# Patient Record
Sex: Male | Born: 1995 | Race: Black or African American | Hispanic: No | Marital: Married | Smoking: Never smoker
Health system: Southern US, Community
[De-identification: ages and names within clinical notes are randomized; demographics above are authoritative.]

---

## 2015-03-05 ENCOUNTER — Emergency Department (HOSPITAL_COMMUNITY)
Admission: EM | Admit: 2015-03-05 | Discharge: 2015-03-05 | Disposition: A | Discharge status: Home / Self Care | Payer: Medicaid Other | Attending: Emergency Medicine | Admitting: Emergency Medicine

## 2015-03-05 ENCOUNTER — Encounter (HOSPITAL_COMMUNITY): Payer: Self-pay | Admitting: Emergency Medicine

## 2015-03-05 DIAGNOSIS — R51 Headache: Secondary | ICD-10-CM | POA: Insufficient documentation

## 2015-03-05 DIAGNOSIS — A630 Anogenital (venereal) warts: Secondary | ICD-10-CM

## 2015-03-05 DIAGNOSIS — R519 Headache, unspecified: Secondary | ICD-10-CM

## 2015-03-05 LAB — URINALYSIS, ROUTINE W REFLEX MICROSCOPIC
Glucose, UA: NEGATIVE mg/dL
Hgb urine dipstick: NEGATIVE
KETONES UR: 15 mg/dL — AB
Leukocytes, UA: NEGATIVE
NITRITE: NEGATIVE
PROTEIN: 30 mg/dL — AB
SPECIFIC GRAVITY, URINE: 1.033 — AB (ref 1.005–1.030)
UROBILINOGEN UA: 1 mg/dL (ref 0.0–1.0)
pH: 5.5 (ref 5.0–8.0)

## 2015-03-05 LAB — URINE MICROSCOPIC-ADD ON

## 2015-03-05 MED ORDER — ACETAMINOPHEN 325 MG PO TABS
650.0000 mg | ORAL_TABLET | Freq: Once | ORAL | Status: AC
  Administered 2015-03-05: 650 mg via ORAL
  Filled 2015-03-05: qty 2

## 2015-03-05 NOTE — ED Provider Notes (Signed)
The patient is a 19 year old male, recently moved here from [place], complains of mild headache but also complains of multiple lesions on his penis. On exam the patient has normal neurologic exam, soft abdomen, multiple small flat papular lesions on the shaft of his penis consistent with genital warts, no discharge from the urethral meatus, well-appearing otherwise. We'll test for STDs, send off for HIV, patient appears stable for discharge. We'll start anti-inflammatory for headache.  Medical screening examination/treatment/procedure(s) were conducted as a shared visit with non-physician practitioner(s) and myself.  I personally evaluated the patient during the encounter.  Clinical Impression:   Final diagnoses:  Genital warts  Headache, unspecified headache type         Eber Hong, MD 03/05/15 1534

## 2015-03-05 NOTE — ED Notes (Signed)
Patient states headache x 2 days.   Patient states has not tried anything for the headache at home.

## 2015-03-05 NOTE — ED Provider Notes (Signed)
CSN: [id number]     Arrival date & time 03/05/15  1017 History   First MD Initiated Contact with Patient 03/05/15 1130     Chief Complaint  Patient presents with  . Headache   Used interpretor services for interview  HPI  Calvin Moore is a 19 year old male with no significant past medical history presenting with a headache x 2 days. Pt reports generalized headache that has been constant since Wednesday. States the pain was very severe yesterday but has improved since last night. He has not tried any over the counter medicines for pain control. Denies fevers, visual disturbances, syncope, neck pain, abdominal pain, nausea or vomiting. Denies recent head traumas. Pt also complaining of rash to his genitals. States it has been there for 2-3 months. Denies dysuria, penile discharge, penile pain or testicular pain. Pt is recent immigrant from [place] in past 2 months and has not established primary care yet.   History reviewed. No pertinent past medical history. History reviewed. No pertinent past surgical history. No family history on file. Social History  Substance Use Topics  . Smoking status: Never Smoker   . Smokeless tobacco: None  . Alcohol Use: No    Review of Systems  Constitutional: Negative for fever and chills.  Eyes: Negative for visual disturbance.  Gastrointestinal: Negative for nausea and vomiting.  Genitourinary: Negative for dysuria, discharge, penile pain and testicular pain.  Musculoskeletal: Negative for neck pain and neck stiffness.  Skin: Positive for rash.  Neurological: Positive for headaches. Negative for syncope.      Allergies  Review of patient's allergies indicates no known allergies.  Home Medications   Prior to Admission medications   Not on File   BP 145/89 mmHg  Pulse 70  Temp(Src) 98 F (36.7 C) (Oral)  Resp 20  SpO2 100% Physical Exam  Constitutional: He is oriented to person, place, and time. He appears well-developed and  well-nourished.  HENT:  Head: Normocephalic and atraumatic.  Mouth/Throat: Oropharynx is clear and moist. No oropharyngeal exudate.  Eyes: Conjunctivae and EOM are normal. Pupils are equal, round, and reactive to light.  Neck: Normal range of motion.  Cardiovascular: Normal rate, regular rhythm and normal heart sounds.   Pulmonary/Chest: Effort normal. No respiratory distress. He has no wheezes. He has no rales.  Abdominal: Soft. There is no tenderness.  Genitourinary: Testes normal. Right testis shows no tenderness. Left testis shows no tenderness. Circumcised. No penile erythema or penile tenderness. No discharge found.  Multiple flesh colored papules consistent with genital warts over the penis. No erythema or open lesions over penis or scrotum.   Musculoskeletal: Normal range of motion.  Neurological: He is alert and oriented to person, place, and time. No cranial nerve deficit.  5/5 motor strength in extremities. Sensation to light touch intact throughout  Skin: Skin is warm and dry. Rash noted.  Psychiatric: He has a normal mood and affect. His behavior is normal.  Nursing note and vitals reviewed.   ED Course  Procedures (including critical care time) Labs Review Labs Reviewed  URINALYSIS, ROUTINE W REFLEX MICROSCOPIC (NOT AT [facility]) - Abnormal; Notable for the following:    Color, Urine AMBER (*)    Specific Gravity, Urine 1.033 (*)    Bilirubin Urine SMALL (*)    Ketones, ur 15 (*)    Protein, ur 30 (*)    All other components within normal limits  URINE MICROSCOPIC-ADD ON - Abnormal; Notable for the following:    Squamous Epithelial /  LPF FEW (*)    Bacteria, UA FEW (*)    All other components within normal limits  HIV ANTIBODY (ROUTINE TESTING)  GC/CHLAMYDIA PROBE AMP ([place]) NOT AT [facility]    Imaging Review No results found. I have personally reviewed and evaluated these images and lab results as part of my medical decision-making.   EKG Interpretation None       MDM   Final diagnoses:  Genital warts  Headache, unspecified headache type   Pt presenting with headache and genital rash. Pt states headache has been improving since onset 2 days ago. Denies visual or neurological symptoms. Also complaining of genital warts x 2-3 months. No penile discharge or testicular tenderness. VSS. Pt nontoxic. Non-focal neurological exam. Pelvic exam consistent with genital warts. Tylenol given in ED with improvement in headache. Discussed with pt that we cannot treat genital warts in ED, will give him referral to [facility] for further discussion of treatment options. Educated on safe sex practices to prevent spread of warts. Will call pt if GC or HIV comes back positive. Bacteria noted in urine, will send urine for culture. Pt not complaining of urinary symptoms at this time. Pt may use OTC tylenol for headache control. Return precautions given in discharge paperwork and discussed with pt. Pt stable for discharge.     Alveta Heimlich, PA-C 03/05/15 1322  Eber Hong, MD 03/05/15 1534

## 2015-03-05 NOTE — Discharge Instructions (Signed)
-   Call [facility] to establish primary care and further discuss genital warts treatment - If gonorrhea, chlamydia or HIV is positive, the hospital will call you with results - Continue to take tylenol for headache - Return to ED with fever, increasing severity of headache, neck pain, changes in vision, weakness or numbness, penile discharge, testicular pain or worsening symptoms

## 2015-03-06 LAB — HIV ANTIBODY (ROUTINE TESTING W REFLEX): HIV Screen 4th Generation wRfx: NONREACTIVE

## 2015-03-07 LAB — URINE CULTURE: CULTURE: NO GROWTH

## 2015-03-08 LAB — GC/CHLAMYDIA PROBE AMP (~~LOC~~) NOT AT ARMC
Chlamydia: NEGATIVE
Neisseria Gonorrhea: NEGATIVE

## 2016-02-22 ENCOUNTER — Emergency Department (HOSPITAL_COMMUNITY)
Admission: EM | Admit: 2016-02-22 | Discharge: 2016-02-22 | Disposition: A | Discharge status: Home / Self Care | Payer: BLUE CROSS/BLUE SHIELD | Attending: Emergency Medicine | Admitting: Emergency Medicine

## 2016-02-22 ENCOUNTER — Encounter (HOSPITAL_COMMUNITY): Payer: Self-pay | Admitting: Emergency Medicine

## 2016-02-22 DIAGNOSIS — S80211A Abrasion, right knee, initial encounter: Secondary | ICD-10-CM | POA: Insufficient documentation

## 2016-02-22 DIAGNOSIS — Y999 Unspecified external cause status: Secondary | ICD-10-CM | POA: Insufficient documentation

## 2016-02-22 DIAGNOSIS — Y939 Activity, unspecified: Secondary | ICD-10-CM | POA: Insufficient documentation

## 2016-02-22 DIAGNOSIS — S80212A Abrasion, left knee, initial encounter: Secondary | ICD-10-CM | POA: Insufficient documentation

## 2016-02-22 DIAGNOSIS — Y92410 Unspecified street and highway as the place of occurrence of the external cause: Secondary | ICD-10-CM | POA: Insufficient documentation

## 2016-02-22 DIAGNOSIS — Z23 Encounter for immunization: Secondary | ICD-10-CM | POA: Insufficient documentation

## 2016-02-22 DIAGNOSIS — T148XXA Other injury of unspecified body region, initial encounter: Secondary | ICD-10-CM

## 2016-02-22 DIAGNOSIS — S39012A Strain of muscle, fascia and tendon of lower back, initial encounter: Secondary | ICD-10-CM

## 2016-02-22 MED ORDER — TETANUS-DIPHTH-ACELL PERTUSSIS 5-2.5-18.5 LF-MCG/0.5 IM SUSP
0.5000 mL | Freq: Once | INTRAMUSCULAR | Status: AC
  Administered 2016-02-22: 0.5 mL via INTRAMUSCULAR
  Filled 2016-02-22: qty 0.5

## 2016-02-22 MED ORDER — IBUPROFEN 800 MG PO TABS
800.0000 mg | ORAL_TABLET | Freq: Once | ORAL | Status: AC
  Administered 2016-02-22: 800 mg via ORAL
  Filled 2016-02-22: qty 1

## 2016-02-22 MED ORDER — METHOCARBAMOL 500 MG PO TABS
500.0000 mg | ORAL_TABLET | Freq: Once | ORAL | Status: AC
  Administered 2016-02-22: 500 mg via ORAL
  Filled 2016-02-22: qty 1

## 2016-02-22 MED ORDER — METHOCARBAMOL 500 MG PO TABS: 500.0000 mg | ORAL_TABLET | Freq: Two times a day (BID) | ORAL | 0 refills | Status: DC

## 2016-02-22 MED ORDER — NAPROXEN 500 MG PO TABS: 500.0000 mg | ORAL_TABLET | Freq: Two times a day (BID) | ORAL | 0 refills | Status: DC
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
## 2016-08-01 ENCOUNTER — Encounter (HOSPITAL_COMMUNITY): Payer: Self-pay

## 2016-08-01 ENCOUNTER — Emergency Department (HOSPITAL_COMMUNITY): Payer: Self-pay

## 2016-08-01 ENCOUNTER — Emergency Department (HOSPITAL_COMMUNITY)
Admission: EM | Admit: 2016-08-01 | Discharge: 2016-08-01 | Disposition: A | Discharge status: Home / Self Care | Payer: Self-pay | Attending: Emergency Medicine | Admitting: Emergency Medicine

## 2016-08-01 DIAGNOSIS — Y999 Unspecified external cause status: Secondary | ICD-10-CM | POA: Insufficient documentation

## 2016-08-01 DIAGNOSIS — Y92009 Unspecified place in unspecified non-institutional (private) residence as the place of occurrence of the external cause: Secondary | ICD-10-CM | POA: Insufficient documentation

## 2016-08-01 DIAGNOSIS — M25531 Pain in right wrist: Secondary | ICD-10-CM | POA: Insufficient documentation

## 2016-08-01 DIAGNOSIS — Y939 Activity, unspecified: Secondary | ICD-10-CM | POA: Insufficient documentation

## 2016-08-01 DIAGNOSIS — W1839XA Other fall on same level, initial encounter: Secondary | ICD-10-CM | POA: Insufficient documentation

## 2016-08-01 MED ORDER — IBUPROFEN 600 MG PO TABS: 600.0000 mg | ORAL_TABLET | Freq: Four times a day (QID) | ORAL | 0 refills | Status: DC | PRN

## 2016-08-01 NOTE — ED Provider Notes (Signed)
I was called by radiologist, Dr Constance Goltzlson.  He ammended his xray reading.  He is recommending a repeat imaging test because of the fad pad finding.  Pt was sent home with recommendations to follow up with orthopedics and was splinted considering that xray finding.   Linwood DibblesJon Howard Patton, MD 08/01/16 [phone]

## 2016-08-01 NOTE — ED Provider Notes (Signed)
[facility]-EMERGENCY DEPT Provider Note   CSN: [id number] Arrival date & time: 08/01/16  1203  By signing my name below, I, Teofilo PodMatthew P. Jamison, attest that this documentation has been prepared under the direction and in the presence of Arthor CaptainAbigail Lakeesha Fontanilla, PA-C. Electronically Signed: Teofilo PodMatthew P. Jamison, ED Scribe. 08/01/2016. 2:46 PM.    History   Chief Complaint No chief complaint on file.   The history is provided by the patient. The history is limited by a language barrier. A language interpreter was used.   HPI Comments:  Calvin Moore is a 21 y.o. male who presents to the Emergency Department s/p right wrist injury that occurred yesterday. Pt reports that he fell at home, bracing his fall with his right hand, and states that today his right wrist became increasingly swollen and painful today. Pt applied an ointment to his wrist that provided some relief. He states that the pain has improved significantly since yesterday. Pt is right hand dominant and work at a [employer]. Pt denies other associated symptoms.   History reviewed. No pertinent past medical history.  There are no active problems to display for this patient.   History reviewed. No pertinent surgical history.     Home Medications    Prior to Admission medications   Medication Sig Start Date End Date Taking? Authorizing Provider  methocarbamol (ROBAXIN) 500 MG tablet Take 1 tablet (500 mg total) by mouth 2 (two) times daily. 02/22/16   April Palumbo, MD  naproxen (NAPROSYN) 500 MG tablet Take 1 tablet (500 mg total) by mouth 2 (two) times daily. 02/22/16   April Palumbo, MD    Family History No family history on file.  Social History Social History  Substance Use Topics  . Smoking status: Never Smoker  . Smokeless tobacco: Not on file  . Alcohol use No     Allergies   Patient has no known allergies.   Review of Systems Review of Systems  Constitutional: Negative for fever.  Musculoskeletal: Positive for  arthralgias and joint swelling.     Physical Exam Updated Vital Signs BP 132/80 (BP Location: Left Arm)   Pulse 85   Temp 98.7 F (37.1 C) (Oral)   Resp 18   SpO2 100%   Physical Exam  Constitutional: He appears well-developed and well-nourished. No distress.  HENT:  Head: Normocephalic and atraumatic.  Eyes: Conjunctivae are normal.  Cardiovascular: Normal rate.   Pulmonary/Chest: Effort normal.  Abdominal: He exhibits no distension.  Musculoskeletal:  Pain and swelling over right scaphoid. Pain with ulnar deviation of wrist on the radial side.   Neurological: He is alert.  Skin: Skin is warm and dry.  Psychiatric: He has a normal mood and affect.  Nursing note and vitals reviewed.    ED Treatments / Results  DIAGNOSTIC STUDIES:  Oxygen Saturation is 100% on RA, normal by my interpretation.    COORDINATION OF CARE:  2:42 PM Will order xray. Discussed treatment plan with pt at bedside and pt agreed to plan.   Labs (all labs ordered are listed, but only abnormal results are displayed) Labs Reviewed - No data to display  EKG  EKG Interpretation None       Radiology No results found.  Procedures Procedures (including critical care time)  Medications Ordered in ED Medications - No data to display   Initial Impression / Assessment and Plan / ED Course  I have reviewed the triage vital signs and the nursing notes.  Pertinent labs & imaging results that  were available during my care of the patient were reviewed by me and considered in my medical decision making (see chart for details).     Patient with pain over the scaphoid. Images pending. I have given sign out to the oncoming PA.   Final Clinical Impressions(s) / ED Diagnoses   Final diagnoses:  Right wrist pain    New Prescriptions New Prescriptions   No medications on file  I personally performed the services described in this documentation, which was scribed in my presence. The recorded  information has been reviewed and is accurate.       Arthor Captain, PA-C 08/08/16 1610    Linwood Dibbles, MD 08/08/16 [phone]

## 2016-08-01 NOTE — ED Notes (Signed)
Through interpretor. Pt state fall on outstretched hand on Monday afternoon about 2 PM.

## 2016-08-01 NOTE — ED Notes (Signed)
Unable to esign due to computor.

## 2016-08-01 NOTE — ED Provider Notes (Signed)
Care assumed from previous provider PA Harris. Please see note for further details. To here for right wrist pain. Case discussed, plan agreed upon. Will follow up on pending x-ray.   X-ray reviewed showing no fracture or dislocation. It does note a slight prominent appearance of the pronator quadratus fat pad which can be a secondary finding with distal radial fracture. Patient is tender to palpation over the area on exam. Concern for occult distal radial fracture. Discussed findings with patient using [employer] interpreters. Recommended applying splint, however patient does not want this to be done. Will place him in Velcro wrist splint. Orthopedic follow-up strongly encouraged, especially if symptoms persist. Ibuprofen as needed for pain. Home care instructions discussed and all questions answered.   [facility] Regional HospitalJaime Pilcher Dior Stepter, PA-C 08/01/16 1713    Calvin DibblesJon Knapp, MD 08/01/16 Calvin [id number]

## 2016-08-01 NOTE — ED Triage Notes (Addendum)
Patient complains of right wrist pain x 1 week, pain with any movement, unsure if injury occurred, need interpretor. Couldn't connect at triage. No swelling, no deformity

## 2016-08-01 NOTE — Discharge Instructions (Signed)
Keep splint on at all times for the next week. If pain persists on Friday, please call the orthopedic clinic listed to schedule a follow up appointment. Return to ER for new or worsening symptoms, any additional concerns.   COLD THERAPY DIRECTIONS:  Ice or gel packs can be used to reduce both pain and swelling. Ice is the most helpful within the first 24 to 48 hours after an injury or flareup from overusing a muscle or joint.  Ice is effective, has very few side effects, and is safe for most people to use.   If you expose your skin to cold temperatures for too long or without the proper protection, you can damage your skin or nerves. Watch for signs of skin damage due to cold.   HOME CARE INSTRUCTIONS  Follow these tips to use ice and cold packs safely.  Place a dry or damp towel between the ice and skin. A damp towel will cool the skin more quickly, so you may need to shorten the time that the ice is used.  For a more rapid response, add gentle compression to the ice.  Ice for no more than 10 to 20 minutes at a time. The bonier the area you are icing, the less time it will take to get the benefits of ice.  Check your skin after 5 minutes to make sure there are no signs of a poor response to cold or skin damage.  Rest 20 minutes or more in between uses.  Once your skin is numb, you can end your treatment. You can test numbness by very lightly touching your skin. The touch should be so light that you do not see the skin dimple from the pressure of your fingertip. When using ice, most people will feel these normal sensations in this order: cold, burning, aching, and numbness.

## 2016-08-01 NOTE — ED Notes (Addendum)
Pt states through interpretor that he understands instructions. All questions answered.Home stable stable with steady gait after pt states that he will return here for any new or worsening problems.

## 2016-08-01 NOTE — ED Notes (Signed)
Patient returned from xray.

## 2016-08-03 ENCOUNTER — Encounter (HOSPITAL_COMMUNITY): Payer: Self-pay | Admitting: Emergency Medicine

## 2016-08-03 ENCOUNTER — Emergency Department (HOSPITAL_COMMUNITY)
Admission: EM | Admit: 2016-08-03 | Discharge: 2016-08-03 | Disposition: A | Discharge status: Home / Self Care | Payer: Medicaid Other | Attending: Emergency Medicine | Admitting: Emergency Medicine

## 2016-08-03 DIAGNOSIS — S63501D Unspecified sprain of right wrist, subsequent encounter: Secondary | ICD-10-CM | POA: Insufficient documentation

## 2016-08-03 DIAGNOSIS — S63501A Unspecified sprain of right wrist, initial encounter: Secondary | ICD-10-CM

## 2016-08-03 DIAGNOSIS — X58XXXD Exposure to other specified factors, subsequent encounter: Secondary | ICD-10-CM | POA: Insufficient documentation

## 2016-08-03 NOTE — ED Notes (Signed)
ED Provider at bedside. 

## 2016-08-03 NOTE — ED Notes (Signed)
NAD at this time. Pt is stable and going home.  
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
## 2016-08-03 NOTE — Discharge Instructions (Signed)
Please follow up with family doctor or urgent care as needed.

## 2016-08-03 NOTE — ED Provider Notes (Signed)
[facility]-EMERGENCY DEPT Provider Note   CSN: [id number] Arrival date & time: 08/03/16  1024     History   Chief Complaint Chief Complaint  Patient presents with  . work note    HPI Calvin GurneyHenry Moore is a 21 y.o. male.  HPI Calvin Moore is a 21 y.o. male presents to emergency department requesting a work note to go back to work. Patient injured his wrist several days ago. He was seen on 08/01/16, just 2 days ago, and initial x-ray was performed which was negative. According to the notes, radiologist called back and repeated imaging was recommended due to some "fat pad finding", and it was recommended that he would have an MRI or repeat imaging in 7-10 days if not improving. Patient is here because he states his wrist is completely better and he is having no pain in his wrist and no pain with movement. He states that his job would not let him go back because of a prior work note which restricted him from heavy lifting for 7 days. 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 He states that he has no pain at all and would like to be cleared to go back to work.  History reviewed. No pertinent past medical history.  There are no active problems to display for this patient.   History reviewed. No pertinent surgical history.     Home Medications    Prior to Admission medications   Medication Sig Start Date End Date Taking? Authorizing Provider  ibuprofen (ADVIL,MOTRIN) 600 MG tablet Take 1 tablet (600 mg total) by mouth every 6 (six) hours as needed for moderate pain. 08/01/16   Chase PicketJaime Pilcher Ward, PA-C  methocarbamol (ROBAXIN) 500 MG tablet Take 1 tablet (500 mg total) by mouth 2 (two) times daily. 02/22/16   Cy BlamerApril Palumbo, MD    Family History History reviewed. No pertinent family history.  Social History Social History  Substance Use Topics  . Smoking status: Never Smoker  . Smokeless tobacco: Not on file  . Alcohol use No     Allergies   Patient has no known  allergies.   Review of Systems Review of Systems  Constitutional: Negative for fever.  Musculoskeletal: Positive for arthralgias.  Neurological: Negative for weakness and numbness.     Physical Exam Updated Vital Signs BP 131/70 (BP Location: Left Arm)   Pulse 76   Temp 97.5 F (36.4 C) (Oral)   Resp 18   SpO2 100%   Physical Exam  Constitutional: He appears well-developed and well-nourished. No distress.  Eyes: Conjunctivae are normal.  Neck: Neck supple.  Cardiovascular: Normal rate.   Pulmonary/Chest: No respiratory distress.  Abdominal: He exhibits no distension.  Musculoskeletal:  Normal appearing right wrist. No ttp over wrist joint, carpals, specifically no pain over scaphoid. Full ROM of the wrist and all fingers. Distal radial pulse intact  Skin: Skin is warm and dry.  Nursing note and vitals reviewed.    ED Treatments / Results  Labs (all labs ordered are listed, but only abnormal results are displayed) Labs Reviewed - No data to display  EKG  EKG Interpretation None       Radiology Dg Wrist Complete Right  Addendum Date: 08/01/2016   ADDENDUM REPORT: 08/01/2016 18:43 ADDENDUM: If the patient has persistent discomfort, follow-up plain film exam in 7-10 days or MR can be obtained for further delineation to exclude occult injury. These results were called by telephone at the time of interpretation on 08/01/2016  at 6:40 pm to Dr. Linwood Dibbles, who verbally acknowledged these results. Electronically Signed   By: Lacy Duverney M.D.   On: 08/01/2016 18:43   Result Date: 08/01/2016 CLINICAL DATA:  21 year old fell yesterday with wrist pain. Initial encounter. EXAM: RIGHT WRIST - COMPLETE 3+ VIEW COMPARISON:  None. FINDINGS: Slightly prominent appearance of the pronator quadratus fat pad (can be a secondary finding with distal radial fracture) however, no underlying fracture noted. Top-normal size space between the scaphoid and the lunate. IMPRESSION: No fracture or  dislocation noted. Electronically Signed: By: Lacy Duverney M.D. On: 08/01/2016 16:36    Procedures Procedures (including critical care time)  Medications Ordered in ED Medications - No data to display   Initial Impression / Assessment and Plan / ED Course  I have reviewed the triage vital signs and the nursing notes.  Pertinent labs & imaging results that were available during my care of the patient were reviewed by me and considered in my medical decision making (see chart for details).     Patient in emergency department requesting a note to go back to work. He injured his wrist several days ago, he was seen here and imaged 2 days ago with x-ray being read as negative however it was recommended to repeat plain film in 7-10 days or MRI for further evaluation. Patient is here because his wrist does not hurt any longer, and he would like to go back to work. His exam is normal. He has no tenderness over the wrist joint or scaphoid. I do not think that repeat imaging at 2 days would be helpful for further evaluation and given that he has no pain or findings on exam, I do think it is okay that he goes back to work. I have instructed him to follow-up with his doctor or urgent care if his wrist becomes painful again. I will give him a work note.  Swahili  language interpreter was used for [employer]  Final Clinical Impressions(s) / ED Diagnoses   Final diagnoses:  Sprain of right wrist, initial encounter    New Prescriptions Discharge Medication List as of 08/03/2016 11:58 AM       Jaynie Crumble, PA-C 08/03/16 1330    Nira Conn, MD 08/03/16 1400

## 2016-08-03 NOTE — ED Triage Notes (Signed)
Pt is here requesting a change in his work note due to restrictions. Pt states he wants to go back to work with no restrictions. Pt speaks no english, translated using interpreter line. Pt has wrist brace on right wrist. I informed patient that I am unable to change his work note limitations per the MD and he will need to be reassessed pt verbalized understanding.

## 2017-10-25 ENCOUNTER — Other Ambulatory Visit: Payer: Self-pay

## 2017-10-25 ENCOUNTER — Encounter (HOSPITAL_COMMUNITY): Payer: Self-pay | Admitting: Emergency Medicine

## 2017-10-25 ENCOUNTER — Emergency Department (HOSPITAL_COMMUNITY)
Admission: EM | Admit: 2017-10-25 | Discharge: 2017-10-25 | Disposition: A | Discharge status: Home / Self Care | Payer: BLUE CROSS/BLUE SHIELD | Attending: Emergency Medicine | Admitting: Emergency Medicine

## 2017-10-25 DIAGNOSIS — M25562 Pain in left knee: Secondary | ICD-10-CM | POA: Insufficient documentation

## 2017-10-25 DIAGNOSIS — R030 Elevated blood-pressure reading, without diagnosis of hypertension: Secondary | ICD-10-CM

## 2017-10-25 DIAGNOSIS — Z79899 Other long term (current) drug therapy: Secondary | ICD-10-CM | POA: Insufficient documentation

## 2017-10-25 DIAGNOSIS — M25561 Pain in right knee: Secondary | ICD-10-CM | POA: Insufficient documentation

## 2017-10-25 DIAGNOSIS — M79609 Pain in unspecified limb: Secondary | ICD-10-CM

## 2017-10-25 NOTE — Discharge Instructions (Addendum)
Please see the information and instructions below regarding your visit.  Your diagnoses today include:  1. Popliteal pain    We suspect that you have muscle strain secondary to work.  There appears to be no emergent causes.  Please follow-up with any of the first 3 clinics listed in your paperwork for primary care.  Please follow-up with the orthopedic doctor, Dr. Jena Gauss regarding her leg pain.  You also have elevated blood pressure today.  It is very important that she follow-up with the first 3 clinics I listed on the paperwork to establish a primary care provider who is a regular doctor for you.  Tests performed today include: See side panel of your discharge paperwork for testing performed today. Vital signs are listed at the bottom of these instructions.   Medications prescribed:    Take any prescribed medications only as prescribed, and any over the counter medications only as directed on the packaging.  Home care instructions:  Please follow any educational materials contained in this packet.   Follow-up instructions: Please follow-up with your primary care provider in as soon as possible for further evaluation of your symptoms if they are not completely improved.   Please help with orthopedics for further evaluation of your legs.  Return instructions:  Please return to the Emergency Department if you experience worsening symptoms. Please return the emergency department if you develop any worsening leg pain, swelling of the legs, change in color of the legs. Please return if you have any other emergent concerns.  Additional Information:   Your vital signs today were: BP (!) 149/82 (BP Location: Right Arm)    Pulse 72    Temp 98.9 F (37.2 C) (Oral)    Resp 18    Ht  (1.778 m)    Wt 72.6 kg (160 lb)    SpO2 100%    BMI 22.96 kg/m  If your blood pressure (BP) was elevated on multiple readings during this visit above 130 for the top number or above 80 for the bottom  number, please have this repeated by your primary care provider within one month. --------------  Thank you for allowing [place] to participate in your care today.

## 2017-10-25 NOTE — ED Notes (Signed)
Pt verbalizes understanding of d/c instructions. Pt ambulatory at d/c with all belongings.   

## 2017-10-25 NOTE — ED Triage Notes (Signed)
Pt speaks Swahili, presents with R leg pain that started a few days ago, pt thinks it is r/t to overuse at work. Pt presents with a "return note" from his job, states that pt was in [facility] today which is NOT true.

## 2017-10-25 NOTE — ED Notes (Addendum)
ED Provider at bedside. Audio interpreter in use.

## 2017-10-26 NOTE — ED Provider Notes (Signed)
[facility] EMERGENCY DEPARTMENT Provider Note   CSN: [id number] Arrival date & time: 10/25/17  1920     History   Chief Complaint Chief Complaint  Patient presents with  . Leg Pain    HPI Albion Weatherholtz is a 22 y.o. male.  HPI  Patient is a 22 year old male with no significant past medical history presenting for popliteal discomfort alternating between bilateral legs.  Patient primarily complains of the left leg today.  Patient reports that approximately 5 days ago he began having a soreness in the popliteal region of his right leg.  Subsequently, over the last 2 days he developed discomfort that he describes only as a "pain" in the popliteal region of the left leg.  Patient reporting that he only feels pain when he has to do lifting and twisting duties at work.  Patient reports that he does significant squatting and lifting in his job, and presented to the facility nurse to request later duty due to pain in his legs.  Patient was instructed that he needed to follow-up with a physician who could clear him and alter his duties.  Patient reports that he believes his legs have muscle soreness secondary to the type of work he does.  Patient denies any swelling of the legs, change in color, paresthesias, or weakness.  Patient denies any history of DVT/PE, hormone use, cancer history of treatment, recent immobilization, hospitalization, recent surgery.  Patient denies any chest pain or shortness of breath.  Patient reports that he has tried icing the legs and taking a "medicine", which did not provide for relief.  History obtained with assistance of [place] voice call Swahili interpreter, Lina Sar [phone].  History reviewed. No pertinent past medical history.  There are no active problems to display for this patient.   History reviewed. No pertinent surgical history.      Home Medications    Prior to Admission medications   Medication Sig Start Date End Date Taking?  Authorizing Provider  ibuprofen (ADVIL,MOTRIN) 600 MG tablet Take 1 tablet (600 mg total) by mouth every 6 (six) hours as needed for moderate pain. 08/01/16   Ward, Chase Picket, PA-C  methocarbamol (ROBAXIN) 500 MG tablet Take 1 tablet (500 mg total) by mouth 2 (two) times daily. 02/22/16   Palumbo, April, MD    Family History No family history on file.  Social History Social History   Tobacco Use  . Smoking status: Never Smoker  Substance Use Topics  . Alcohol use: No  . Drug use: No     Allergies   Patient has no known allergies.   Review of Systems Review of Systems  Constitutional: Negative for chills and fever.  Cardiovascular: Negative for leg swelling.  Musculoskeletal: Positive for arthralgias and myalgias. Negative for gait problem and joint swelling.  Skin: Negative for color change.  Neurological: Negative for weakness and numbness.     Physical Exam Updated Vital Signs BP (!) 149/82 (BP Location: Right Arm)   Pulse 72   Temp 98.9 F (37.2 C) (Oral)   Resp 18   Ht  (1.778 m)   Wt 72.6 kg (160 lb)   SpO2 100%   BMI 22.96 kg/m   Physical Exam  Constitutional: He appears well-developed and well-nourished. No distress.  Sitting comfortably in bed.  HENT:  Head: Normocephalic and atraumatic.  Eyes: Conjunctivae are normal. Right eye exhibits no discharge. Left eye exhibits no discharge.  EOMs normal to gross examination.  Neck: Normal range of  motion.  Cardiovascular: Normal rate, regular rhythm and normal heart sounds.  No murmur heard. Intact, 2+ DP and PT pulses bilaterally.  Pulmonary/Chest:  Normal respiratory effort. Patient converses comfortably. No audible wheeze or stridor.  Abdominal: He exhibits no distension.  Musculoskeletal: Normal range of motion.  Patient exhibits normal range of motion with knee flexion, extension and foot and toe dorsiflexion and plantar flexion bilaterally. Left popliteal region with tenderness overlying  gastrocnemius and soleus insertions.  No mass.  No fluctuance.  Right popliteal region with minimal tenderness overlying gastric numbness and soleus tendons.  No mass or fluctuance palpated. Bilateral calves without edema or tenderness.  All compartments soft. 2+ DP and PT pulses bilaterally.  Capillary refill is approximately 3 seconds in bilateral great toes.  Neurological: He is alert.  Cranial nerves intact to gross observation. Patient moves extremities without difficulty.  Skin: Skin is warm and dry. He is not diaphoretic.  Psychiatric: He has a normal mood and affect. His behavior is normal. Judgment and thought content normal.  Nursing note and vitals reviewed.    ED Treatments / Results  Labs (all labs ordered are listed, but only abnormal results are displayed) Labs Reviewed - No data to display  EKG None  Radiology No results found.  Procedures Procedures (including critical care time)  Medications Ordered in ED Medications - No data to display   Initial Impression / Assessment and Plan / ED Course  I have reviewed the triage vital signs and the nursing notes.  Pertinent labs & imaging results that were available during my care of the patient were reviewed by me and considered in my medical decision making (see chart for details).     Patient nontoxic-appearing and neurovascularly intact in bilateral lower extremities.  Patient does exhibit delayed capillary refill but pulses are strong.  Doubt DVT, as patient has no risk factors for DVT, and there is no swelling focal pain along veins of popliteal region to suggest this pathology.  Doubt arterial occlusion, as patient has normal coloration of lower extremities, and no pain at rest.  Additionally, bilateral nature not suggestive of these pathologies.  Suspect strain of gastrocnemius and soleus tendons.  Bilateral knee sleeves ordered for patient, and paperwork for employer filled out to reduce duties.  Duties only  reduced for 4 days, patient instructed that he will need to follow-up with a primary care provider and orthopedics for further management.    This is a shared visit with Dr. Gerhard Munch. Patient was independently evaluated by this attending physician. Attending physician consulted in evaluation and discharge management.   Final Clinical Impressions(s) / ED Diagnoses   Final diagnoses:  Popliteal pain  Elevated blood pressure reading without diagnosis of hypertension    ED Discharge Orders    None       Delia Chimes 10/26/17 0418    Gerhard Munch, MD 10/27/17 [phone]

## 2020-09-15 ENCOUNTER — Other Ambulatory Visit: Payer: Self-pay

## 2020-09-15 ENCOUNTER — Emergency Department (HOSPITAL_COMMUNITY)
Admission: EM | Admit: 2020-09-15 | Discharge: 2020-09-15 | Disposition: A | Discharge status: Home / Self Care | Payer: No Typology Code available for payment source | Attending: Emergency Medicine | Admitting: Emergency Medicine

## 2020-09-15 ENCOUNTER — Encounter (HOSPITAL_COMMUNITY): Payer: Self-pay

## 2020-09-15 DIAGNOSIS — S01511A Laceration without foreign body of lip, initial encounter: Secondary | ICD-10-CM | POA: Diagnosis not present

## 2020-09-15 DIAGNOSIS — Y92410 Unspecified street and highway as the place of occurrence of the external cause: Secondary | ICD-10-CM | POA: Diagnosis not present

## 2020-09-15 DIAGNOSIS — S0993XA Unspecified injury of face, initial encounter: Secondary | ICD-10-CM | POA: Diagnosis present

## 2020-09-15 DIAGNOSIS — K0889 Other specified disorders of teeth and supporting structures: Secondary | ICD-10-CM

## 2020-09-15 MED ORDER — ACETAMINOPHEN 500 MG PO TABS
1000.0000 mg | ORAL_TABLET | Freq: Once | ORAL | Status: AC
  Administered 2020-09-15: 1000 mg via ORAL
  Filled 2020-09-15: qty 2

## 2020-09-15 MED ORDER — KETOROLAC TROMETHAMINE 60 MG/2ML IM SOLN
15.0000 mg | Freq: Once | INTRAMUSCULAR | Status: AC
  Administered 2020-09-15: 15 mg via INTRAMUSCULAR
  Filled 2020-09-15: qty 2

## 2020-09-15 NOTE — ED Notes (Signed)
Pt discharged from this ED in stable condition at this time. All discharge instructions and follow up care reviewed with pt with no further questions at this time. Pt ambulatory with steady gait, clear speech.  
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
## 2020-09-15 NOTE — Discharge Instructions (Addendum)
Take 4 over the counter ibuprofen tablets 3 times a day or 2 over-the-counter naproxen tablets twice a day for pain. Also take tylenol 1000mg (2 extra strength) four times a day.   Follow up with a dentist for your tooth.   Kunywa vidonge 4 vya ibuprofen mara 3 kwa siku au vidonge 2 vya naproxen mara mbili kwa siku kwa maumivu. Pia chukua tylenol 1000mg (nguvu 2 za ziada) mara nne kwa siku.   Fuata daktari wa meno kwa jino lako
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
## 2021-09-13 ENCOUNTER — Emergency Department (HOSPITAL_COMMUNITY): Payer: BC Managed Care – PPO

## 2021-09-13 ENCOUNTER — Inpatient Hospital Stay (HOSPITAL_COMMUNITY)
Admission: EM | Admit: 2021-09-13 | Discharge: 2021-09-24 | DRG: 178 | Disposition: A | Discharge status: Home / Self Care | Payer: BC Managed Care – PPO | Attending: Family Medicine | Admitting: Family Medicine

## 2021-09-13 ENCOUNTER — Other Ambulatory Visit: Payer: Self-pay

## 2021-09-13 DIAGNOSIS — E871 Hypo-osmolality and hyponatremia: Secondary | ICD-10-CM | POA: Diagnosis present

## 2021-09-13 DIAGNOSIS — Z79899 Other long term (current) drug therapy: Secondary | ICD-10-CM

## 2021-09-13 DIAGNOSIS — J90 Pleural effusion, not elsewhere classified: Secondary | ICD-10-CM | POA: Diagnosis not present

## 2021-09-13 DIAGNOSIS — Y95 Nosocomial condition: Secondary | ICD-10-CM | POA: Diagnosis present

## 2021-09-13 DIAGNOSIS — J851 Abscess of lung with pneumonia: Principal | ICD-10-CM | POA: Diagnosis present

## 2021-09-13 DIAGNOSIS — D696 Thrombocytopenia, unspecified: Secondary | ICD-10-CM | POA: Diagnosis present

## 2021-09-13 DIAGNOSIS — J189 Pneumonia, unspecified organism: Secondary | ICD-10-CM | POA: Diagnosis present

## 2021-09-13 DIAGNOSIS — R161 Splenomegaly, not elsewhere classified: Secondary | ICD-10-CM | POA: Diagnosis present

## 2021-09-13 DIAGNOSIS — F1010 Alcohol abuse, uncomplicated: Secondary | ICD-10-CM | POA: Diagnosis present

## 2021-09-13 DIAGNOSIS — D72819 Decreased white blood cell count, unspecified: Secondary | ICD-10-CM | POA: Diagnosis present

## 2021-09-13 DIAGNOSIS — F1090 Alcohol use, unspecified, uncomplicated: Secondary | ICD-10-CM | POA: Diagnosis present

## 2021-09-13 DIAGNOSIS — R1011 Right upper quadrant pain: Principal | ICD-10-CM

## 2021-09-13 DIAGNOSIS — I3139 Other pericardial effusion (noninflammatory): Secondary | ICD-10-CM | POA: Diagnosis present

## 2021-09-13 DIAGNOSIS — R0602 Shortness of breath: Secondary | ICD-10-CM

## 2021-09-13 DIAGNOSIS — Z20822 Contact with and (suspected) exposure to covid-19: Secondary | ICD-10-CM | POA: Diagnosis present

## 2021-09-13 DIAGNOSIS — J918 Pleural effusion in other conditions classified elsewhere: Secondary | ICD-10-CM | POA: Diagnosis present

## 2021-09-13 DIAGNOSIS — J9811 Atelectasis: Secondary | ICD-10-CM | POA: Diagnosis present

## 2021-09-13 DIAGNOSIS — E876 Hypokalemia: Secondary | ICD-10-CM

## 2021-09-13 DIAGNOSIS — J948 Other specified pleural conditions: Secondary | ICD-10-CM | POA: Diagnosis not present

## 2021-09-13 DIAGNOSIS — R0789 Other chest pain: Secondary | ICD-10-CM

## 2021-09-13 DIAGNOSIS — K429 Umbilical hernia without obstruction or gangrene: Secondary | ICD-10-CM | POA: Diagnosis present

## 2021-09-13 DIAGNOSIS — K7030 Alcoholic cirrhosis of liver without ascites: Secondary | ICD-10-CM | POA: Diagnosis present

## 2021-09-13 DIAGNOSIS — Z789 Other specified health status: Secondary | ICD-10-CM | POA: Diagnosis present

## 2021-09-13 LAB — CBC WITH DIFFERENTIAL/PLATELET
Abs Immature Granulocytes: 0.98 10*3/uL — ABNORMAL HIGH (ref 0.00–0.07)
Basophils Absolute: 0 10*3/uL (ref 0.0–0.1)
Basophils Relative: 0 %
Eosinophils Absolute: 0 10*3/uL (ref 0.0–0.5)
Eosinophils Relative: 0 %
HCT: 42.8 % (ref 39.0–52.0)
Hemoglobin: 13.7 g/dL (ref 13.0–17.0)
Immature Granulocytes: 11 %
Lymphocytes Relative: 4 %
Lymphs Abs: 0.3 10*3/uL — ABNORMAL LOW (ref 0.7–4.0)
MCH: 27.3 pg (ref 26.0–34.0)
MCHC: 32 g/dL (ref 30.0–36.0)
MCV: 85.4 fL (ref 80.0–100.0)
Monocytes Absolute: 0.3 10*3/uL (ref 0.1–1.0)
Monocytes Relative: 4 %
Neutro Abs: 7.4 10*3/uL (ref 1.7–7.7)
Neutrophils Relative %: 81 %
Platelets: UNDETERMINED 10*3/uL (ref 150–400)
RBC: 5.01 MIL/uL (ref 4.22–5.81)
RDW: 14.9 % (ref 11.5–15.5)
WBC: 9.1 10*3/uL (ref 4.0–10.5)
nRBC: 0 % (ref 0.0–0.2)

## 2021-09-13 LAB — COMPREHENSIVE METABOLIC PANEL
ALT: 34 U/L (ref 0–44)
AST: 26 U/L (ref 15–41)
Albumin: 3.5 g/dL (ref 3.5–5.0)
Alkaline Phosphatase: 39 U/L (ref 38–126)
Anion gap: 11 (ref 5–15)
BUN: 13 mg/dL (ref 6–20)
CO2: 27 mmol/L (ref 22–32)
Calcium: 9.3 mg/dL (ref 8.9–10.3)
Chloride: 98 mmol/L (ref 98–111)
Creatinine, Ser: 1.16 mg/dL (ref 0.61–1.24)
GFR, Estimated: >60 mL/min (ref >60)
Glucose, Bld: 121 mg/dL — ABNORMAL HIGH (ref 70–99)
Potassium: 3.7 mmol/L (ref 3.5–5.1)
Sodium: 136 mmol/L (ref 135–145)
Total Bilirubin: 1.4 mg/dL — ABNORMAL HIGH (ref 0.3–1.2)
Total Protein: 7.4 g/dL (ref 6.5–8.1)

## 2021-09-13 LAB — D-DIMER, QUANTITATIVE: D-Dimer, Quant: 1.48 ug/mL-FEU — ABNORMAL HIGH (ref 0.00–0.50)

## 2021-09-13 LAB — TROPONIN I (HIGH SENSITIVITY)
Troponin I (High Sensitivity): 9 ng/L (ref <18)
Troponin I (High Sensitivity): 8 ng/L (ref <18)

## 2021-09-13 LAB — LIPASE, BLOOD: Lipase: 27 U/L (ref 11–51)

## 2021-09-13 MED ORDER — SODIUM CHLORIDE 0.9 % IV SOLN
2.0000 g | INTRAVENOUS | Status: DC
  Administered 2021-09-14 – 2021-09-15 (×2): 2 g via INTRAVENOUS
  Filled 2021-09-13 (×2): qty 20

## 2021-09-13 MED ORDER — SODIUM CHLORIDE 0.9 % IV SOLN
500.0000 mg | INTRAVENOUS | Status: DC
  Administered 2021-09-14 – 2021-09-15 (×2): 500 mg via INTRAVENOUS
  Filled 2021-09-13 (×3): qty 5

## 2021-09-13 MED ORDER — SODIUM CHLORIDE 0.9 % IV BOLUS
1000.0000 mL | Freq: Once | INTRAVENOUS | Status: AC
Start: 2021-09-13 — End: 2021-09-13
  Administered 2021-09-13: 1000 mL via INTRAVENOUS

## 2021-09-13 MED ORDER — MORPHINE SULFATE (PF) 2 MG/ML IV SOLN
2.0000 mg | INTRAVENOUS | Status: DC | PRN
  Administered 2021-09-14: 4 mg via INTRAVENOUS
  Filled 2021-09-13: qty 2

## 2021-09-13 MED ORDER — FENTANYL CITRATE PF 50 MCG/ML IJ SOSY: 50.0000 ug | PREFILLED_SYRINGE | Freq: Once | INTRAMUSCULAR | Status: DC

## 2021-09-13 MED ORDER — SODIUM CHLORIDE 0.9 % IV SOLN
1.0000 g | Freq: Once | INTRAVENOUS | Status: AC
  Administered 2021-09-13: 1 g via INTRAVENOUS
  Filled 2021-09-13: qty 10

## 2021-09-13 MED ORDER — IOHEXOL 350 MG/ML SOLN
100.0000 mL | Freq: Once | INTRAVENOUS | Status: AC | PRN
  Administered 2021-09-13: 100 mL via INTRAVENOUS

## 2021-09-13 MED ORDER — ENOXAPARIN SODIUM 40 MG/0.4ML IJ SOSY: 40.0000 mg | PREFILLED_SYRINGE | INTRAMUSCULAR | Status: DC

## 2021-09-13 MED ORDER — FENTANYL CITRATE PF 50 MCG/ML IJ SOSY
50.0000 ug | PREFILLED_SYRINGE | Freq: Once | INTRAMUSCULAR | Status: AC
  Administered 2021-09-13: 50 ug via INTRAVENOUS
  Filled 2021-09-13: qty 1

## 2021-09-13 MED ORDER — SODIUM CHLORIDE 0.9 % IV SOLN
500.0000 mg | Freq: Once | INTRAVENOUS | Status: AC
  Administered 2021-09-13: 500 mg via INTRAVENOUS
  Filled 2021-09-13: qty 5

## 2021-09-13 NOTE — ED Provider Triage Note (Signed)
Emergency Medicine Provider Triage Evaluation Note ? ?[name] , a 26 y.o. male  was evaluated in triage.  Pt complains of shoulder chest and back pain for the past few days.  Denies any new exercises or lifting.  No medical conditions. ? ?History obtained with interpreter ? ?Review of Systems  ?Positive: As above ?Negative: Abdominal pain, nausea or vomiting ? ?Physical Exam  ?BP (!) 120/100 (BP Location: Right Arm)   Pulse 92   Temp 98.9 ?F (37.2 ?C) (Oral)   Resp 12   SpO2 98%  ?Gen:   Awake, no distress   ?Resp:  Normal effort  ?MSK:   Moves extremities without difficulty, pain worsened right shoulder movements. ?Other:  RRR, lung sounds clear but reports severe pain with inspiration ? ?Medical Decision Making  ?Medically screening exam initiated at 10:40 AM.  Appropriate orders placed.  Othal Kubitz was informed that the remainder of the evaluation will be completed by another provider, this initial triage assessment does not replace that evaluation, and the importance of remaining in the ED until their evaluation is complete. ? ?Appears to be musculoskeletal ?  ?Saddie Benders, PA-C ?09/13/21 1043 ? ?

## 2021-09-13 NOTE — Assessment & Plan Note (Addendum)
Presented with shortness of breath, cough, right-sided chest pain, right upper quadrant pain.  CT chest on admission showed right upper lobe consolidation and patchy right lower lobe airspace disease suggesting multifocal pneumonia. Also showed ill-defined nodular densities in the left upper lobe likely infectious/inflammatory. ?His chest pain is pleuritic in nature and is associated with pneumonia. ?On unaysn. ?Hospital course remarkable for persistent fever, though he does not require oxygen.  CT chest done on 3/24 showed worsening pneumonia,possible  posterior right upper lobe pulmonary necrosis,moderate locular right pleural effusion, increased.  PCCM consulted. S/P chest tube placement. ?Follow-up chest x-ray/CT chest has shown large, loculated right pleural effusion.Dense, masslike partially cavitary lesion of the posterior right ?upper lobe measuring 7.1 x 7.0 cm. ?Status post DNA lytics x2. ?CT surgery consulted today ?Continue Dilaudid for severe pleuritic chest pain.  No leukocytosis, blood pressure stable.  Follow-up pleural fluid culture:hasnot shown any organism ?

## 2021-09-13 NOTE — H&P (Signed)
?History and Physical  ? ? ?Patient: Calvin Moore DOB: 09/08/1995 ?DOA: 09/13/2021 ?DOS: the patient was seen and examined on 09/13/2021 ?PCP: Patient, No Pcp Per (Inactive)  ?Patient coming from: Home ? ?Chief Complaint:  ?Chief Complaint  ?Patient presents with  ? Chest Pain  ? Abdominal Pain  ? ?HPI: Calvin GurneyHenry Hufstetler is a 26 y.o. male with medical history significant of Previously healthy. ? ?Pt presents to ED with c/o R sided CP and RUQ abd pain.  Onset Sunday, progressively worsening since that time.  Pain worse with deep inspiration or coughing.  Associated SOB. ? ?No N/V/D, fever, URI, lightheadedness, headache, syncope. ? ?  ?Review of Systems: As mentioned in the history of present illness. All other systems reviewed and are negative. ?No past medical history on file. ?No past surgical history on file. ?Social History:  reports that he has never smoked. He has never used smokeless tobacco. He reports that he does not drink alcohol and does not use drugs. ? ?No Known Allergies ? ?No family history on file. ? ?Prior to Admission medications   ?Not on File  ? ? ?Physical Exam: ?Vitals:  ? 09/13/21 2100 09/13/21 2115 09/13/21 2130 09/13/21 2145  ?BP: 132/89 134/82 126/83 116/70  ?Pulse: (!) 101 (!) 117 95 97  ?Resp: (!) 38 (!) 25 (!) 33 (!) 33  ?Temp:      ?TempSrc:      ?SpO2: 99% 98% 99% 98%  ? ?Constitutional: NAD, ill appearing ?Eyes: PERRL, lids and conjunctivae normal ?ENMT: Mucous membranes are moist. Posterior pharynx clear of any exudate or lesions.Normal dentition.  ?Neck: normal, supple, no masses, no thyromegaly ?Respiratory: Shallow Respirations ?Cardiovascular: Regular rate and rhythm, no murmurs / rubs / gallops. No extremity edema. 2+ pedal pulses. No carotid bruits.  ?Abdomen: RUQ TTP, umbilical hernia not painful ?Musculoskeletal: no clubbing / cyanosis. No joint deformity upper and lower extremities. Good ROM, no contractures. Normal muscle tone.  ?Skin: no rashes, lesions,  ulcers. No induration ?Neurologic: CN 2-12 grossly intact. Sensation intact, DTR normal. Strength 5/5 in all 4.  ?Psychiatric: Normal judgment and insight. Alert and oriented x 3. Normal mood.  ? ?Data Reviewed: ? ?CT chest abd pel: ?1) no PE ?2) multifocal PNA ?3) partially loculated small R pleural effusion ?4) splenomegaly ?5) umbilical hernia containing bowel. ? ?RUQ US: ?1) no cholecystitis ?2) ? cirrhosis ? ? ?Assessment and Plan: ?* Multifocal pneumonia ?PNA pathway ?Empiric rocephin + azithromycin ?Check covid + flu ?Check RVP ?Morphine PRN pain ?See discussion below as well. ? ?Splenomegaly ?Unclear cause, also ? Cirrhosis on US. ?Normal liver morphology on CT though. ?T.bili 1.4 ?Check EBV ?Check hepatitis pnl ?Check HIV ? ?Pleural effusion ?Looks like small parapneumonic effusion. ?Will hold off on ordering drainage for the moment, defer to day team if they want this. ? ? ? ? ? Advance Care Planning:   Code Status: Full Code ? ?Consults: None ? ?Family Communication: Mother at bedside ? ?Severity of Illness: ?The appropriate patient status for this patient is OBSERVATION. Observation status is judged to be reasonable and necessary in order to provide the required intensity of service to ensure the patient's safety. The patient's presenting symptoms, physical exam findings, and initial radiographic and laboratory data in the context of their medical condition is felt to place them at decreased risk for further clinical deterioration. Furthermore, it is anticipated that the patient will be medically stable for discharge from the hospital within 2 midnights of admission.  ? ?Author: ?Tia Gelb,  Summit Borchardt M., DO ?09/13/2021 11:52 PM ? ?For on call review [email or web address].  ?

## 2021-09-13 NOTE — ED Triage Notes (Signed)
Translator pt/ stated, I have pain from shoulder down, started yesterday. This is new. ?

## 2021-09-13 NOTE — Assessment & Plan Note (Addendum)
Likely alcohol related.  Has thrombocytopenia ?

## 2021-09-13 NOTE — ED Provider Notes (Signed)
?[facility] ?Provider Note ? ? ?CSN: [id number] ?Arrival date & time: 09/13/21  1004 ? ?  ? ?History ? ?Chief Complaint  ?Patient presents with  ? Chest Pain  ? Abdominal Pain  ? ? ?Imagene GurneyHenry Moore is a 26 y.o. male with chief complaint of abdominal pain along the right side, radiating up the right side of the chest and to the right shoulder blade.  Started Sunday and has progressively worsened since then.  Pain rated 100% per pt.  Short of breath due to abdominal pain severity.  Denies N/V, constipation, diarrhea.  Denies recent fever, upper respiratory infection, cough, congestion.  Denies lightheadedness, headache, syncope.  Denies numbness/tingling of upper or lower extremities.  Mother provides some of the history due to patient's pain.  No known cardiac hx. ? ?The history is provided by the patient, medical records and a parent. The history is limited by a language barrier. A language interpreter was used.  ? ?Interpreter from the [employer] was utilized via Architectural [employer] chat by the name of Leonette MostCharles # Moore . ? ? ?  ? ?Home Medications ?Prior to Admission medications   ?Medication Sig Start Date End Date Taking? Authorizing Provider  ?ibuprofen (ADVIL,MOTRIN) 600 MG tablet Take 1 tablet (600 mg total) by mouth every 6 (six) hours as needed for moderate pain. 08/01/16   Ward, Chase PicketJaime Pilcher, PA-C  ?methocarbamol (ROBAXIN) 500 MG tablet Take 1 tablet (500 mg total) by mouth 2 (two) times daily. 02/22/16   Palumbo, April, MD  ?   ? ?Allergies    ?Patient has no known allergies.   ? ?Review of Systems   ?Review of Systems  ?Respiratory:  Positive for shortness of breath.   ?Gastrointestinal:  Positive for abdominal pain.  ? ?Physical Exam ?Updated Vital Signs ?BP (!) 143/112 (BP Location: Right Arm)   Pulse 86   Temp 98.9 ?F (37.2 ?C) (Oral)   Resp 16   SpO2 100%  ?Physical Exam ?Vitals and nursing note reviewed.  ?Constitutional:   ?   General: He is not in  acute distress. ?   Appearance: He is well-developed. He is ill-appearing.  ?HENT:  ?   Head: Normocephalic and atraumatic.  ?   Nose: Nose normal.  ?   Mouth/Throat:  ?   Mouth: Mucous membranes are moist.  ?   Pharynx: Oropharynx is clear.  ?Eyes:  ?   General: No scleral icterus. ?   Conjunctiva/sclera: Conjunctivae normal.  ?Cardiovascular:  ?   Rate and Rhythm: Normal rate and regular rhythm.  ?   Pulses: Normal pulses.     ?     Radial pulses are 2+ on the right side and 2+ on the left side.  ?   Heart sounds: Normal heart sounds. No murmur heard. ?Pulmonary:  ?   Effort: Pulmonary effort is normal. Tachypnea present. No prolonged expiration or respiratory distress.  ?   Breath sounds: Normal breath sounds. No decreased breath sounds or wheezing.  ?   Comments: Shallow respirations ?Chest:  ? ? ?   Comments: Subjective radiating pain, non-TTP ?Abdominal:  ?   General: Bowel sounds are normal.  ?   Palpations: Abdomen is soft.  ?   Tenderness: There is abdominal tenderness in the right upper quadrant and right lower quadrant.  ?   Hernia: A hernia is present. Hernia is present in the umbilical area.  ? ? ?   Comments: Significant tenderness, TTP  ?Musculoskeletal:     ?  General: No swelling.  ?     Arms: ? ?   Cervical back: Neck supple. No rigidity or tenderness.  ?   Right lower leg: No edema.  ?   Left lower leg: No edema.  ?   Comments: Subjective radiating pain, non-TTP ? ?Upper extremities appear neurovascularly intact  ?Skin: ?   General: Skin is warm and dry.  ?   Capillary Refill: Capillary refill takes less than 2 seconds.  ?Neurological:  ?   Mental Status: He is alert and oriented to person, place, and time.  ?   Sensory: No sensory deficit.  ?Psychiatric:     ?   Mood and Affect: Mood normal.  ? ? ?ED Results / Procedures / Treatments   ?Labs ?(all labs ordered are listed, but only abnormal results are displayed) ?Labs Reviewed  ?CBC WITH DIFFERENTIAL/PLATELET - Abnormal; Notable for the  following components:  ?    Result Value  ? Lymphs Abs 0.3 (*)   ? Abs Immature Granulocytes 0.98 (*)   ? All other components within normal limits  ?COMPREHENSIVE METABOLIC PANEL - Abnormal; Notable for the following components:  ? Glucose, Bld 121 (*)   ? Total Bilirubin 1.4 (*)   ? All other components within normal limits  ?D-DIMER, QUANTITATIVE - Abnormal; Notable for the following components:  ? D-Dimer, Quant 1.48 (*)   ? All other components within normal limits  ?CBC - Abnormal; Notable for the following components:  ? Hemoglobin 12.8 (*)   ? HCT 38.8 (*)   ? Platelets 63 (*)   ? All other components within normal limits  ?BASIC METABOLIC PANEL - Abnormal; Notable for the following components:  ? Sodium 134 (*)   ? Glucose, Bld 104 (*)   ? Calcium 8.8 (*)   ? All other components within normal limits  ? ? ?EKG ?EKG Interpretation ? ?Date/Time:  Tuesday September 13 2021 16:40:05 EDT ?Ventricular Rate:  96 ?PR Interval:  158 ?QRS Duration: 91 ?QT Interval:  345 ?QTC Calculation: 436 ?R Axis:   56 ?Text Interpretation: Sinus rhythm Borderline T wave abnormalities Confirmed by Marianna Fuss ([id number]) on 09/13/2021 5:23:34 PM ? ?Radiology ?DG Chest 2 View ? ?Result Date: 09/13/2021 ?CLINICAL DATA:  Chest pain. EXAM: CHEST - 2 VIEW COMPARISON:  None. FINDINGS: The heart size and mediastinal contours are within normal limits. Right upper lobe airspace opacity is noted consistent with pneumonia or atelectasis. Left lung is clear. The visualized skeletal structures are unremarkable. IMPRESSION: Right upper lobe airspace opacity is noted consistent with pneumonia or atelectasis. Electronically Signed   By: Lupita Raider M.D.   On: 09/13/2021 10:54  ? ?CT Angio Chest PE W/Cm &/Or Wo Cm ? ?Result Date: 09/13/2021 ?CLINICAL DATA:  Right lower quadrant pain.  Chest and back pain. EXAM: CT ANGIOGRAPHY CHEST CT ABDOMEN AND PELVIS WITH CONTRAST TECHNIQUE: Multidetector CT imaging of the chest was performed using the standard  protocol during bolus administration of intravenous contrast. Multiplanar CT image reconstructions and MIPs were obtained to evaluate the vascular anatomy. Multidetector CT imaging of the abdomen and pelvis was performed using the standard protocol during bolus administration of intravenous contrast. RADIATION DOSE REDUCTION: This exam was performed according to the departmental dose-optimization program which includes automated exposure control, adjustment of the mA and/or kV according to patient size and/or use of iterative reconstruction technique. CONTRAST:  OMNIPAQUE IOHEXOL 350 MG/ML SOLN COMPARISON:  None. FINDINGS: CTA CHEST FINDINGS Cardiovascular: Satisfactory opacification of the pulmonary  arteries to the segmental level. No evidence of pulmonary embolism. Heart is upper limits of normal in size. No pericardial effusion. Mediastinum/Nodes: There are nonenlarged/prominent right hilar lymph nodes. No enlarged lymph nodes are seen. Esophagus and visualized thyroid within normal limits. Lungs/Pleura: There is a small right pleural effusion, partially loculated. There is a trace left pleural effusion. There is dense right upper lobe consolidation inferiorly. Is also patchy airspace disease in the inferior right lower lobe minimal ill-defined clustered nodular densities with surrounding inflammation in the left upper lobe measuring up to 7 mm image 8/34. Musculoskeletal: No chest wall abnormality. No acute or significant osseous findings. Review of the MIP images confirms the above findings. CT ABDOMEN and PELVIS FINDINGS Hepatobiliary: Gallbladder sludge versus layering stones identified. No pericholecystic inflammation or biliary ductal dilatation. The liver appears within normal limits. Pancreas: Unremarkable. No pancreatic ductal dilatation or surrounding inflammatory changes. Spleen: Moderate/severe splenic enlargement. Spleen measures 20 cm in craniocaudad dimension. No focal splenic lesion.  Adrenals/Urinary Tract: Adrenal glands are unremarkable. Kidneys are normal, without renal calculi, focal lesion, or hydronephrosis. Bladder is unremarkable. Stomach/Bowel: There is diffuse gastric wall thickening versus

## 2021-09-13 NOTE — Assessment & Plan Note (Addendum)
Chest CT showed small to moderate pericardial effusion.  Patient is asymptomatic.  Echocardiogram showed very small pericardial effusion, no evidence of tamponade, normal left ventricle ejection fraction ?

## 2021-09-13 NOTE — ED Notes (Signed)
Patient transported to Ultrasound 

## 2021-09-14 ENCOUNTER — Encounter (HOSPITAL_COMMUNITY): Payer: Self-pay | Admitting: Internal Medicine

## 2021-09-14 ENCOUNTER — Other Ambulatory Visit: Payer: Self-pay

## 2021-09-14 DIAGNOSIS — Z79899 Other long term (current) drug therapy: Secondary | ICD-10-CM | POA: Diagnosis not present

## 2021-09-14 DIAGNOSIS — J90 Pleural effusion, not elsewhere classified: Secondary | ICD-10-CM | POA: Diagnosis not present

## 2021-09-14 DIAGNOSIS — J851 Abscess of lung with pneumonia: Secondary | ICD-10-CM | POA: Diagnosis present

## 2021-09-14 DIAGNOSIS — R1011 Right upper quadrant pain: Secondary | ICD-10-CM | POA: Diagnosis not present

## 2021-09-14 DIAGNOSIS — J189 Pneumonia, unspecified organism: Secondary | ICD-10-CM | POA: Diagnosis present

## 2021-09-14 DIAGNOSIS — Z20822 Contact with and (suspected) exposure to covid-19: Secondary | ICD-10-CM | POA: Diagnosis present

## 2021-09-14 DIAGNOSIS — J918 Pleural effusion in other conditions classified elsewhere: Secondary | ICD-10-CM | POA: Diagnosis present

## 2021-09-14 DIAGNOSIS — D72819 Decreased white blood cell count, unspecified: Secondary | ICD-10-CM | POA: Diagnosis present

## 2021-09-14 DIAGNOSIS — D696 Thrombocytopenia, unspecified: Secondary | ICD-10-CM | POA: Diagnosis present

## 2021-09-14 DIAGNOSIS — R161 Splenomegaly, not elsewhere classified: Secondary | ICD-10-CM | POA: Diagnosis present

## 2021-09-14 DIAGNOSIS — F1010 Alcohol abuse, uncomplicated: Secondary | ICD-10-CM | POA: Diagnosis present

## 2021-09-14 DIAGNOSIS — E871 Hypo-osmolality and hyponatremia: Secondary | ICD-10-CM | POA: Diagnosis present

## 2021-09-14 DIAGNOSIS — Y95 Nosocomial condition: Secondary | ICD-10-CM | POA: Diagnosis present

## 2021-09-14 DIAGNOSIS — K7030 Alcoholic cirrhosis of liver without ascites: Secondary | ICD-10-CM | POA: Diagnosis present

## 2021-09-14 DIAGNOSIS — R0602 Shortness of breath: Secondary | ICD-10-CM | POA: Diagnosis present

## 2021-09-14 DIAGNOSIS — E876 Hypokalemia: Secondary | ICD-10-CM | POA: Diagnosis present

## 2021-09-14 DIAGNOSIS — J948 Other specified pleural conditions: Secondary | ICD-10-CM | POA: Diagnosis not present

## 2021-09-14 DIAGNOSIS — K429 Umbilical hernia without obstruction or gangrene: Secondary | ICD-10-CM | POA: Diagnosis present

## 2021-09-14 DIAGNOSIS — Z789 Other specified health status: Secondary | ICD-10-CM | POA: Diagnosis present

## 2021-09-14 DIAGNOSIS — J9811 Atelectasis: Secondary | ICD-10-CM | POA: Diagnosis present

## 2021-09-14 DIAGNOSIS — I3139 Other pericardial effusion (noninflammatory): Secondary | ICD-10-CM | POA: Diagnosis present

## 2021-09-14 LAB — RESPIRATORY PANEL BY PCR

## 2021-09-14 LAB — BASIC METABOLIC PANEL
Anion gap: 9 (ref 5–15)
BUN: 11 mg/dL (ref 6–20)
CO2: 26 mmol/L (ref 22–32)
Calcium: 8.8 mg/dL — ABNORMAL LOW (ref 8.9–10.3)
Chloride: 99 mmol/L (ref 98–111)
Creatinine, Ser: 1.13 mg/dL (ref 0.61–1.24)
GFR, Estimated: >60 mL/min (ref >60)
Glucose, Bld: 104 mg/dL — ABNORMAL HIGH (ref 70–99)
Potassium: 3.5 mmol/L (ref 3.5–5.1)
Sodium: 134 mmol/L — ABNORMAL LOW (ref 135–145)

## 2021-09-14 LAB — CBC
HCT: 38.8 % — ABNORMAL LOW (ref 39.0–52.0)
Hemoglobin: 12.8 g/dL — ABNORMAL LOW (ref 13.0–17.0)
MCH: 28.1 pg (ref 26.0–34.0)
MCHC: 33 g/dL (ref 30.0–36.0)
MCV: 85.3 fL (ref 80.0–100.0)
Platelets: 63 10*3/uL — ABNORMAL LOW (ref 150–400)
RBC: 4.55 MIL/uL (ref 4.22–5.81)
RDW: 15 % (ref 11.5–15.5)
WBC: 7.6 10*3/uL (ref 4.0–10.5)
nRBC: 0 % (ref 0.0–0.2)

## 2021-09-14 LAB — DIC (DISSEMINATED INTRAVASCULAR COAGULATION)PANEL
D-Dimer, Quant: 2.43 ug/mL-FEU — ABNORMAL HIGH (ref 0.00–0.50)
Fibrinogen: 671 mg/dL — ABNORMAL HIGH (ref 210–475)
INR: 1.5 — ABNORMAL HIGH (ref 0.8–1.2)
Platelets: 64 10*3/uL — ABNORMAL LOW (ref 150–400)
Prothrombin Time: 18 seconds — ABNORMAL HIGH (ref 11.4–15.2)
Smear Review: NONE SEEN
aPTT: 35 seconds (ref 24–36)

## 2021-09-14 LAB — LACTIC ACID, PLASMA
Lactic Acid, Venous: 1.7 mmol/L (ref 0.5–1.9)
Lactic Acid, Venous: 1.4 mmol/L (ref 0.5–1.9)

## 2021-09-14 LAB — HEPATITIS PANEL, ACUTE
HCV Ab: NONREACTIVE
Hep A IgM: NONREACTIVE
Hep B C IgM: NONREACTIVE
Hepatitis B Surface Ag: NONREACTIVE

## 2021-09-14 LAB — RESP PANEL BY RT-PCR (FLU A&B, COVID) ARPGX2
Influenza A by PCR: NEGATIVE
Influenza B by PCR: NEGATIVE
SARS Coronavirus 2 by RT PCR: NEGATIVE

## 2021-09-14 LAB — MONONUCLEOSIS SCREEN: Mono Screen: NEGATIVE

## 2021-09-14 LAB — HIV ANTIBODY (ROUTINE TESTING W REFLEX): HIV Screen 4th Generation wRfx: NONREACTIVE

## 2021-09-14 LAB — PROCALCITONIN: Procalcitonin: 2.49 ng/mL

## 2021-09-14 MED ORDER — LORAZEPAM 1 MG PO TABS: 1.0000 mg | ORAL_TABLET | ORAL | Status: AC | PRN

## 2021-09-14 MED ORDER — HYDROCOD POLI-CHLORPHE POLI ER 10-8 MG/5ML PO SUER
5.0000 mL | Freq: Two times a day (BID) | ORAL | Status: DC
Start: 2021-09-14 — End: 2021-09-24
  Administered 2021-09-14 – 2021-09-24 (×21): 5 mL via ORAL
  Filled 2021-09-14 (×22): qty 5

## 2021-09-14 MED ORDER — LACTATED RINGERS IV SOLN: INTRAVENOUS | Status: DC

## 2021-09-14 MED ORDER — THIAMINE HCL 100 MG PO TABS
100.0000 mg | ORAL_TABLET | Freq: Every day | ORAL | Status: DC
  Administered 2021-09-14 – 2021-09-20 (×7): 100 mg via ORAL
  Filled 2021-09-14 (×7): qty 1

## 2021-09-14 MED ORDER — LORAZEPAM 2 MG/ML IJ SOLN: 1.0000 mg | INTRAMUSCULAR | Status: AC | PRN

## 2021-09-14 MED ORDER — LACTATED RINGERS IV BOLUS
1000.0000 mL | Freq: Once | INTRAVENOUS | Status: AC
  Administered 2021-09-14: 1000 mL via INTRAVENOUS

## 2021-09-14 MED ORDER — THIAMINE HCL 100 MG/ML IJ SOLN: 100.0000 mg | Freq: Every day | INTRAMUSCULAR | Status: DC

## 2021-09-14 MED ORDER — ACETAMINOPHEN 325 MG PO TABS
650.0000 mg | ORAL_TABLET | Freq: Four times a day (QID) | ORAL | Status: DC | PRN
  Administered 2021-09-14 – 2021-09-20 (×13): 650 mg via ORAL
  Filled 2021-09-14 (×14): qty 2

## 2021-09-14 MED ORDER — FOLIC ACID 1 MG PO TABS
1.0000 mg | ORAL_TABLET | Freq: Every day | ORAL | Status: DC
  Administered 2021-09-14 – 2021-09-20 (×7): 1 mg via ORAL
  Filled 2021-09-14 (×7): qty 1

## 2021-09-14 MED ORDER — ADULT MULTIVITAMIN W/MINERALS CH
1.0000 | ORAL_TABLET | Freq: Every day | ORAL | Status: DC
  Administered 2021-09-14 – 2021-09-20 (×7): 1 via ORAL
  Filled 2021-09-14 (×7): qty 1

## 2021-09-14 MED ORDER — OXYCODONE HCL 5 MG PO TABS
5.0000 mg | ORAL_TABLET | Freq: Four times a day (QID) | ORAL | Status: DC | PRN
  Administered 2021-09-14 – 2021-09-20 (×9): 5 mg via ORAL
  Filled 2021-09-14 (×9): qty 1

## 2021-09-14 NOTE — Assessment & Plan Note (Addendum)
Likely associated with splenomegaly/cirrhosis .  Continue to monitor ?

## 2021-09-14 NOTE — Plan of Care (Signed)
?  Problem: Education: ?Goal: Knowledge of General Education information will improve ?Description: Including pain rating scale, medication(s)/side effects and non-pharmacologic comfort measures ?Outcome: Progressing ?  ?Problem: Health Behavior/Discharge Planning: ?Goal: Ability to manage health-related needs will improve ?Outcome: Progressing ?  ?Problem: Clinical Measurements: ?Goal: Ability to maintain clinical measurements within normal limits will improve ?Outcome: Progressing ?Goal: Will remain free from infection ?Outcome: Progressing ?Goal: Diagnostic test results will improve ?Outcome: Progressing ?Goal: Respiratory complications will improve ?Outcome: Progressing ?Goal: Cardiovascular complication will be avoided ?Outcome: Progressing ?  ?Problem: Skin Integrity: ?Goal: Risk for impaired skin integrity will decrease ?Outcome: Progressing ?  ?Problem: Safety: ?Goal: Ability to remain free from injury will improve ?Outcome: Progressing ?  ?Problem: Pain Managment: ?Goal: General experience of comfort will improve ?Outcome: Progressing ?  ?Problem: Elimination: ?Goal: Will not experience complications related to bowel motility ?Outcome: Progressing ?Goal: Will not experience complications related to urinary retention ?Outcome: Progressing ?  ?Problem: Nutrition: ?Goal: Adequate nutrition will be maintained ?Outcome: Progressing ?  ?Problem: Activity: ?Goal: Risk for activity intolerance will decrease ?Outcome: Progressing ?  ?

## 2021-09-14 NOTE — Assessment & Plan Note (Addendum)
Drinks heavily as per the brother.  Started on CIWA protocol.  Continue thiamine and folic acid.  Right upper quadrant ultrasound shows possible cirrhosis. Counseled for cessation . ?

## 2021-09-14 NOTE — TOC CAGE-AID Note (Signed)
Transition of Care (TOC) - CAGE-AID Screening ? ? ?Patient Details  ?Name: Calvin Moore ?MRN: [id number] ?Date of Birth: 1996/04/22 ? ?Transition of Care [facility]) CM/SW Contact:    ?Epifanio Lesches, RN ?Phone Number: ?09/14/2021, 7:55 AM ? ? ?Clinical Narrative: ? Pt  reports that he does not drink alcohol and does not use drugs. ?  ? ? ?CAGE-AID Screening: ?  ? ?Have You Ever Felt You Ought to Cut Down on Your Drinking or Drug Use?: No ?Have People Annoyed You By Critizing Your Drinking Or Drug Use?: No ?Have You Felt Bad Or Guilty About Your Drinking Or Drug Use?: No ?Have You Ever Had a Drink or Used Drugs First Thing In The Morning to Steady Your Nerves or to Get Rid of a Hangover?: No ?CAGE-AID Score: 0 ? ?  ? ?  ? ? ? ? ? ? ?

## 2021-09-14 NOTE — Plan of Care (Signed)

## 2021-09-14 NOTE — Progress Notes (Signed)
?PROGRESS NOTE ? ?Calvin Moore  FWY:[id number] DOB: 20-Jan-1996 DOA: 09/13/2021 ?PCP: Patient, No Pcp Per (Inactive)  ? ?Brief Narrative: ?Patient is a 26 year old male with history of chronic alcohol abuse who presented to the emergency department with complaints of right-sided chest pain, right upper quadrant pain.  Pain was worse with deep inspiration/coughing, he was also complaining of shortness of breath.  Chest imaging shows stable multifocal pneumonia.  Currently on IV antibiotics.  Respiratory status is improving. ? ?Assessment & Plan: ? ?Principal Problem: ?  Multifocal pneumonia ?Active Problems: ?  Pleural effusion ?  Splenomegaly ?  Thrombocytopenia (HCC) ?  Alcohol use ?  Hospital acquired PNA ? ? ?Assessment and Plan: ?* Multifocal pneumonia ?Presented with shortness of breath, cough, right-sided chest pain, right upper quadrant pain.  CT chest shows right upper lobe consolidation and patchy right lower lobe airspace disease suggesting multifocal pneumonia. Also showed ill-defined nodular densities in the left upper lobe likely infectious/inflammatory. Follow-up imaging recommended in 3 months. ?Patient has history of heavy alcohol use, aspiration pneumonia is a possibility.  Continue current antibiotics. ?His chest pain is pleuritic in nature and is associated with pneumonia. ?We will check follow-up chest x-ray tomorrow morning. ? ?Alcohol use ?Drinks heavily as per the brother.  Started on CIWA protocol.  Continue thiamine and folic acid.  Right upper quadrant ultrasound shows possible cirrhosis. Counseled for cessation . ? ?Thrombocytopenia (HCC) ?Likely associated with splenomegaly/cirrhosis .  Continue to monitor ? ?Splenomegaly ?Likely alcohol related.  Has hrombocytopenia ? ?Pleural effusion ?Looks like small parapneumonic effusion associated with PNA ? ? ? ? ?  ? ? ?  ?  ? ?DVT prophylaxis:Place and maintain sequential compression device Start: 09/14/21 0341 ? ? ?  Code Status: Full  Code ? ?Family Communication: Mother at bedside ? ?Patient status:Inpatient  ? ?Patient is from :Home ? ?Anticipated discharge YD:XAJO ? ?Estimated DC date:tomorrow ? ? ?Consultants: None ? ?Procedures:None ? ?Antimicrobials:  ?Anti-infectives (From admission, onward)  ? ? Start     Dose/Rate Route Frequency Ordered Stop  ? 09/14/21 2200  cefTRIAXone (ROCEPHIN) 2 g in sodium chloride 0.9 % 100 mL IVPB       ? 2 g ?200 mL/hr over 30 Minutes Intravenous Every 24 hours 09/13/21 2233 09/19/21 2159  ? 09/14/21 2200  azithromycin (ZITHROMAX) 500 mg in sodium chloride 0.9 % 250 mL IVPB       ? 500 mg ?250 mL/hr over 60 Minutes Intravenous Every 24 hours 09/13/21 2233 09/19/21 2159  ? 09/13/21 2015  cefTRIAXone (ROCEPHIN) 1 g in sodium chloride 0.9 % 100 mL IVPB       ? 1 g ?200 mL/hr over 30 Minutes Intravenous  Once 09/13/21 2012 09/13/21 2143  ? 09/13/21 2015  azithromycin (ZITHROMAX) 500 mg in sodium chloride 0.9 % 250 mL IVPB       ? 500 mg ?250 mL/hr over 60 Minutes Intravenous  Once 09/13/21 2012 09/13/21 2244  ? ?  ? ? ?Subjective: ?Patient seen and examined at the bedside this morning.  Hemodynamically stable.  On 1 L of oxygen per minute.  Not in any kind of respiratory distress.  Continues to complain of right-sided chest pain.  Mother at bedside ? ?Objective: ?Vitals:  ? 09/14/21 0305 09/14/21 0320 09/14/21 0749 09/14/21 1059  ?BP:  117/67 120/64   ?Pulse:  (!) 116 74   ?Resp:  (!) 28 20   ?Temp:  (!) 103.1 ?F (39.5 ?C) 99.9 ?F (37.7 ?C)   ?TempSrc:  Axillary Oral   ?SpO2:  94% 97% 99%  ?Weight: 78.9 kg     ?Height: 5\' 7"  (1.702 m)     ? ?No intake or output data in the 24 hours ending 09/14/21 1137 ?Filed Weights  ? 09/14/21 0305  ?Weight: 78.9 kg  ? ? ?Examination: ? ?General exam: Overall comfortable, not in distress ?HEENT: PERRL ?Respiratory system:  no wheezes or crackles, diminished air entry on the right side ?Cardiovascular system: S1 & S2 heard, RRR.  ?Gastrointestinal system: Abdomen is nondistended,  soft and nontender. ?Central nervous system: Alert and oriented ?Extremities: No edema, no clubbing ,no cyanosis ?Skin: No rashes, no ulcers,no icterus   ? ? ?Data Reviewed: I have personally reviewed following labs and imaging studies ? ?CBC: ?Recent Labs  ?Lab 09/13/21 ?1647 09/14/21 ?0036 09/14/21 ?0503  ?WBC 9.1 7.6  --   ?NEUTROABS 7.4  --   --   ?HGB 13.7 12.8*  --   ?HCT 42.8 38.8*  --   ?MCV 85.4 85.3  --   ?PLT PLATELET CLUMPS NOTED ON SMEAR, UNABLE TO ESTIMATE 63* 64*  ? ?Basic Metabolic Panel: ?Recent Labs  ?Lab 09/13/21 ?1647 09/14/21 ?0036  ?NA 136 134*  ?K 3.7 3.5  ?CL 98 99  ?CO2 27 26  ?GLUCOSE 121* 104*  ?BUN 13 11  ?CREATININE 1.16 1.13  ?CALCIUM 9.3 8.8*  ? ? ? ?Recent Results (from the past 240 hour(s))  ?Resp Panel by RT-PCR (Flu A&B, Covid) Nasopharyngeal Swab     Status: None  ? Collection Time: 09/14/21 12:15 AM  ? Specimen: Nasopharyngeal Swab; Nasopharyngeal(NP) swabs in vial transport medium  ?Result Value Ref Range Status  ? SARS Coronavirus 2 by RT PCR NEGATIVE NEGATIVE Final  ?  Comment: (NOTE) ?SARS-CoV-2 target nucleic acids are NOT DETECTED. ? ?The SARS-CoV-2 RNA is generally detectable in upper respiratory ?specimens during the acute phase of infection. The lowest ?concentration of SARS-CoV-2 viral copies this assay can detect is ?138 copies/mL. A negative result does not preclude SARS-Cov-2 ?infection and should not be used as the sole basis for treatment or ?other patient management decisions. A negative result may occur with  ?improper specimen collection/handling, submission of specimen other ?than nasopharyngeal swab, presence of viral mutation(s) within the ?areas targeted by this assay, and inadequate number of viral ?copies(<138 copies/mL). A negative result must be combined with ?clinical observations, patient history, and epidemiological ?information. The expected result is Negative. ? ?Fact Sheet for Patients:  ?BloggerCourse.comhttps://www.fda.gov/media/152166/download ? ?Fact Sheet for  Healthcare Providers:  ?SeriousBroker.ithttps://www.fda.gov/media/152162/download ? ?This test is no t yet approved or cleared by the [place] FDA and  ?has been authorized for detection and/or diagnosis of SARS-CoV-2 by ?FDA under an Emergency Use Authorization (EUA). This EUA will remain  ?in effect (meaning this test can be used) for the duration of the ?COVID-19 declaration under Section 564(b)(1) of the Act, 21 ?U.S.C.section 360bbb-3(b)(1), unless the authorization is terminated  ?or revoked sooner.  ? ? ?  ? Influenza A by PCR NEGATIVE NEGATIVE Final  ? Influenza B by PCR NEGATIVE NEGATIVE Final  ?  Comment: (NOTE) ?The Xpert Xpress SARS-CoV-2/FLU/RSV plus assay is intended as an aid ?in the diagnosis of influenza from Nasopharyngeal swab specimens and ?should not be used as a sole basis for treatment. Nasal washings and ?aspirates are unacceptable for Xpert Xpress SARS-CoV-2/FLU/RSV ?testing. ? ?Fact Sheet for Patients: ?BloggerCourse.comhttps://www.fda.gov/media/152166/download ? ?Fact Sheet for Healthcare Providers: ?SeriousBroker.ithttps://www.fda.gov/media/152162/download ? ?This test is not yet approved or cleared by the [place]  FDA and ?has been authorized for detection and/or diagnosis of SARS-CoV-2 by ?FDA under an Emergency Use Authorization (EUA). This EUA will remain ?in effect (meaning this test can be used) for the duration of the ?COVID-19 declaration under Section 564(b)(1) of the Act, 21 U.S.C. ?section 360bbb-3(b)(1), unless the authorization is terminated or ?revoked. ? ?Performed at [facility], [address] [place], [place] ?[address] ?  ?Respiratory (~20 pathogens) panel by PCR     Status: None  ? Collection Time: 09/14/21 12:15 AM  ? Specimen: Nasopharyngeal Swab; Respiratory  ?Result Value Ref Range Status  ? Adenovirus NOT DETECTED NOT DETECTED Final  ? Coronavirus 229E NOT DETECTED NOT DETECTED Final  ?  Comment: (NOTE) ?The Coronavirus on the Respiratory Panel, DOES NOT test for the novel  ?Coronavirus (2019  nCoV) ?  ? Coronavirus HKU1 NOT DETECTED NOT DETECTED Final  ? Coronavirus NL63 NOT DETECTED NOT DETECTED Final  ? Coronavirus OC43 NOT DETECTED NOT DETECTED Final  ? Metapneumovirus NOT DETECTED NOT DETECTED

## 2021-09-14 NOTE — Progress Notes (Addendum)
Pt running fever of 103.x, tachycardic to 110s. ?Lactate only 1.7.  BP 116 systolic ? ?1) ordering 1L LR bolus then 125 cc/hr LR ?2) ordering tylenol PRN fever ?3) changing lovenox to SCDs for platelets of 63 ?4) ordering DIC panel ?5) TTP unlikely given normal renal function and normal mental status. ? ?Update: brother now reporting that patient drinks quite a bit of alcohol quite heavily. ? ?If true this could explain: ?1) thrombocytopenia ?2) cirrhosis findings on [place] ?3) possibly even splenomegaly if pt has portal hypertension ? ?Ill go ahead and put patient on CIWA protocol. ?

## 2021-09-15 ENCOUNTER — Inpatient Hospital Stay (HOSPITAL_COMMUNITY): Payer: BC Managed Care – PPO

## 2021-09-15 MED ORDER — HYDROMORPHONE HCL 1 MG/ML IJ SOLN
0.5000 mg | INTRAMUSCULAR | Status: DC | PRN
  Administered 2021-09-15 – 2021-09-16 (×2): 0.5 mg via INTRAVENOUS
  Filled 2021-09-15 (×2): qty 0.5

## 2021-09-15 NOTE — Plan of Care (Signed)
  Problem: Education: Goal: Knowledge of General Education information will improve Description: Including pain rating scale, medication(s)/side effects and non-pharmacologic comfort measures Outcome: Progressing   Problem: Health Behavior/Discharge Planning: Goal: Ability to manage health-related needs will improve Outcome: Progressing   Problem: Activity: Goal: Risk for activity intolerance will decrease Outcome: Progressing   Problem: Nutrition: Goal: Adequate nutrition will be maintained Outcome: Progressing   Problem: Elimination: Goal: Will not experience complications related to bowel motility Outcome: Progressing Goal: Will not experience complications related to urinary retention Outcome: Progressing   Problem: Safety: Goal: Ability to remain free from injury will improve Outcome: Progressing   Problem: Skin Integrity: Goal: Risk for impaired skin integrity will decrease Outcome: Progressing   

## 2021-09-15 NOTE — Progress Notes (Signed)
?PROGRESS NOTE ? ?Calvin Moore  CBJ:[id number] DOB: August 17, 1995 DOA: 09/13/2021 ?PCP: Patient, No Pcp Per (Inactive)  ? ?Brief Narrative: ?Patient is a 26 year old male with history of chronic alcohol abuse who presented to the emergency department with complaints of right-sided chest pain, right upper quadrant pain.  Pain was worse with deep inspiration/coughing, he was also complaining of shortness of breath.  Chest imaging showed multifocal pneumonia.  Currently on IV antibiotics.  Respiratory status is improving but he again became febrile this mrng. ? ?Assessment & Plan: ? ?Principal Problem: ?  Multifocal pneumonia ?Active Problems: ?  Pleural effusion ?  Splenomegaly ?  Thrombocytopenia (HCC) ?  Alcohol use ?  Hospital acquired PNA ? ? ?Assessment and Plan: ?* Multifocal pneumonia ?Presented with shortness of breath, cough, right-sided chest pain, right upper quadrant pain.  CT chest shows right upper lobe consolidation and patchy right lower lobe airspace disease suggesting multifocal pneumonia. Also showed ill-defined nodular densities in the left upper lobe likely infectious/inflammatory. Follow-up imaging recommended in 3 months. ?Patient has history of heavy alcohol use, aspiration pneumonia is a possibility.  Continue current antibiotics. ?His chest pain is pleuritic in nature and is associated with pneumonia. ?Follow-up chest x-ray this mrng showed stable right upper consolidation, small pleural effusion. ?Continue current antibiotics.Blood cultures ordered ? ?Alcohol use ?Drinks heavily as per the brother.  Started on CIWA protocol.  Continue thiamine and folic acid.  Right upper quadrant ultrasound shows possible cirrhosis. Counseled for cessation . ? ?Thrombocytopenia (HCC) ?Likely associated with splenomegaly/cirrhosis .  Continue to monitor ? ?Splenomegaly ?Likely alcohol related.  Has hrombocytopenia ? ?Pleural effusion ?Looks like small parapneumonic effusion associated with PNA ? ? ? ? ? ? ?   ?  ? ?DVT prophylaxis:Place and maintain sequential compression device Start: 09/14/21 0341 ? ? ?  Code Status: Full Code ? ?Family Communication: Mother at bedside ? ?Patient status:Inpatient  ? ?Patient is from :Home ? ?Anticipated discharge HY:WVPX ? ?Estimated DC date:1-2 days ? ? ?Consultants: None ? ?Procedures:None ? ?Antimicrobials:  ?Anti-infectives (From admission, onward)  ? ? Start     Dose/Rate Route Frequency Ordered Stop  ? 09/14/21 2200  cefTRIAXone (ROCEPHIN) 2 g in sodium chloride 0.9 % 100 mL IVPB       ? 2 g ?200 mL/hr over 30 Minutes Intravenous Every 24 hours 09/13/21 2233 09/18/21 2159  ? 09/14/21 2200  azithromycin (ZITHROMAX) 500 mg in sodium chloride 0.9 % 250 mL IVPB       ? 500 mg ?250 mL/hr over 60 Minutes Intravenous Every 24 hours 09/13/21 2233 09/18/21 2159  ? 09/13/21 2015  cefTRIAXone (ROCEPHIN) 1 g in sodium chloride 0.9 % 100 mL IVPB       ? 1 g ?200 mL/hr over 30 Minutes Intravenous  Once 09/13/21 2012 09/13/21 2143  ? 09/13/21 2015  azithromycin (ZITHROMAX) 500 mg in sodium chloride 0.9 % 250 mL IVPB       ? 500 mg ?250 mL/hr over 60 Minutes Intravenous  Once 09/13/21 2012 09/13/21 2244  ? ?  ? ? ?Subjective: ? ?Patient seen and examined at the bedside this morning.  Remains weak,  on room air.  Complains of pain on the right lower chest on the back.  No report of worsening shortness of breath or cough.  Febrile this morning ? ?Objective: ?Vitals:  ? 09/14/21 2210 09/15/21 0023 09/15/21 0400 09/15/21 0833  ?BP:  114/73 126/79 114/77  ?Pulse:  (!) 102 90 82  ?Resp:  18 19  16  ?Temp: (!) 101.6 ?F (38.7 ?C) (!) 101.2 ?F (38.4 ?C) (!) 102.8 ?F (39.3 ?C) 98 ?F (36.7 ?C)  ?TempSrc: Oral  Axillary Oral  ?SpO2:  97% 93% 94%  ?Weight:      ?Height:      ? ? ?Intake/Output Summary (Last 24 hours) at 09/15/2021 1046 ?Last data filed at 09/14/2021 1500 ?Gross per 24 hour  ?Intake 120 ml  ?Output --  ?Net 120 ml  ? ?Filed Weights  ? 09/14/21 0305  ?Weight: 78.9 kg   ? ? ?Examination: ? ?General exam: Overall comfortable, not in distress ?HEENT: PERRL ?Respiratory system:  no wheezes or crackles , mildly diminished air entry on the right side ?Cardiovascular system: S1 & S2 heard, RRR.  ?Gastrointestinal system: Abdomen is nondistended, soft and nontender. ?Central nervous system: Alert and oriented ?Extremities: No edema, no clubbing ,no cyanosis ?Skin: No rashes, no ulcers,no icterus   ? ? ?Data Reviewed: I have personally reviewed following labs and imaging studies ? ?CBC: ?Recent Labs  ?Lab 09/13/21 ?1647 09/14/21 ?0036 09/14/21 ?0503  ?WBC 9.1 7.6  --   ?NEUTROABS 7.4  --   --   ?HGB 13.7 12.8*  --   ?HCT 42.8 38.8*  --   ?MCV 85.4 85.3  --   ?PLT PLATELET CLUMPS NOTED ON SMEAR, UNABLE TO ESTIMATE 63* 64*  ? ?Basic Metabolic Panel: ?Recent Labs  ?Lab 09/13/21 ?1647 09/14/21 ?0036  ?NA 136 134*  ?K 3.7 3.5  ?CL 98 99  ?CO2 27 26  ?GLUCOSE 121* 104*  ?BUN 13 11  ?CREATININE 1.16 1.13  ?CALCIUM 9.3 8.8*  ? ? ? ?Recent Results (from the past 240 hour(s))  ?Resp Panel by RT-PCR (Flu A&B, Covid) Nasopharyngeal Swab     Status: None  ? Collection Time: 09/14/21 12:15 AM  ? Specimen: Nasopharyngeal Swab; Nasopharyngeal(NP) swabs in vial transport medium  ?Result Value Ref Range Status  ? SARS Coronavirus 2 by RT PCR NEGATIVE NEGATIVE Final  ?  Comment: (NOTE) ?SARS-CoV-2 target nucleic acids are NOT DETECTED. ? ?The SARS-CoV-2 RNA is generally detectable in upper respiratory ?specimens during the acute phase of infection. The lowest ?concentration of SARS-CoV-2 viral copies this assay can detect is ?138 copies/mL. A negative result does not preclude SARS-Cov-2 ?infection and should not be used as the sole basis for treatment or ?other patient management decisions. A negative result may occur with  ?improper specimen collection/handling, submission of specimen other ?than nasopharyngeal swab, presence of viral mutation(s) within the ?areas targeted by this assay, and inadequate  number of viral ?copies(<138 copies/mL). A negative result must be combined with ?clinical observations, patient history, and epidemiological ?information. The expected result is Negative. ? ?Fact Sheet for Patients:  ?BloggerCourse.comhttps://www.fda.gov/media/152166/download ? ?Fact Sheet for Healthcare Providers:  ?SeriousBroker.ithttps://www.fda.gov/media/152162/download ? ?This test is no t yet approved or cleared by the [place] FDA and  ?has been authorized for detection and/or diagnosis of SARS-CoV-2 by ?FDA under an Emergency Use Authorization (EUA). This EUA will remain  ?in effect (meaning this test can be used) for the duration of the ?COVID-19 declaration under Section 564(b)(1) of the Act, 21 ?U.S.C.section 360bbb-3(b)(1), unless the authorization is terminated  ?or revoked sooner.  ? ? ?  ? Influenza A by PCR NEGATIVE NEGATIVE Final  ? Influenza B by PCR NEGATIVE NEGATIVE Final  ?  Comment: (NOTE) ?The Xpert Xpress SARS-CoV-2/FLU/RSV plus assay is intended as an aid ?in the diagnosis of influenza from Nasopharyngeal swab specimens and ?should not be used  as a sole basis for treatment. Nasal washings and ?aspirates are unacceptable for Xpert Xpress SARS-CoV-2/FLU/RSV ?testing. ? ?Fact Sheet for Patients: ?BloggerCourse.com ? ?Fact Sheet for Healthcare Providers: ?SeriousBroker.it ? ?This test is not yet approved or cleared by the [place] FDA and ?has been authorized for detection and/or diagnosis of SARS-CoV-2 by ?FDA under an Emergency Use Authorization (EUA). This EUA will remain ?in effect (meaning this test can be used) for the duration of the ?COVID-19 declaration under Section 564(b)(1) of the Act, 21 U.S.C. ?section 360bbb-3(b)(1), unless the authorization is terminated or ?revoked. ? ?Performed at [facility], [address], [place], [place] ?[address] ?  ?Respiratory (~20 pathogens) panel by PCR     Status: None  ? Collection Time: 09/14/21 12:15 AM  ?  Specimen: Nasopharyngeal Swab; Respiratory  ?Result Value Ref Range Status  ? Adenovirus NOT DETECTED NOT DETECTED Final  ? Coronavirus 229E NOT DETECTED NOT DETECTED Final  ?  Comment: (NOTE) ?The Coronavirus on the Respiratory Panel, DO

## 2021-09-15 NOTE — Progress Notes (Signed)
Mobility Specialist Progress Note  ? ? 09/15/21 1536  ?Mobility  ?Activity Ambulated independently in hallway  ?Level of Assistance Independent after set-up  ?Assistive Device None  ?Distance Ambulated (ft) 1000 ft  ?Activity Response Tolerated well  ?$Mobility charge 1 Mobility  ? ?Pt received in chair and agreeable. No complaints. Returned to chair with call bell in reach and mother present.  ? ?Hildred Alamin ?Mobility Specialist  ?  ?

## 2021-09-15 NOTE — Progress Notes (Signed)
Patient in [place] at time of vital signs.  Informed Dr. Renford Dills at [phone] and RRT, Misty Stanley.  Tylenol given.  Also requested increase in frequency of cough/pain medication.  Awaiting response.  ?

## 2021-09-16 ENCOUNTER — Inpatient Hospital Stay (HOSPITAL_COMMUNITY): Payer: BC Managed Care – PPO

## 2021-09-16 DIAGNOSIS — J90 Pleural effusion, not elsewhere classified: Secondary | ICD-10-CM

## 2021-09-16 LAB — URINALYSIS, ROUTINE W REFLEX MICROSCOPIC
Bacteria, UA: NONE SEEN
Bilirubin Urine: NEGATIVE
Glucose, UA: NEGATIVE mg/dL
Ketones, ur: NEGATIVE mg/dL
Leukocytes,Ua: NEGATIVE
Nitrite: NEGATIVE
Protein, ur: NEGATIVE mg/dL
Specific Gravity, Urine: 1.005 (ref 1.005–1.030)
pH: 7 (ref 5.0–8.0)

## 2021-09-16 LAB — CBC WITH DIFFERENTIAL/PLATELET
Abs Immature Granulocytes: 0.02 10*3/uL (ref 0.00–0.07)
Basophils Absolute: 0 10*3/uL (ref 0.0–0.1)
Basophils Relative: 0 %
Eosinophils Absolute: 0 10*3/uL (ref 0.0–0.5)
Eosinophils Relative: 0 %
HCT: 33.9 % — ABNORMAL LOW (ref 39.0–52.0)
Hemoglobin: 11.4 g/dL — ABNORMAL LOW (ref 13.0–17.0)
Immature Granulocytes: 1 %
Lymphocytes Relative: 16 %
Lymphs Abs: 0.4 10*3/uL — ABNORMAL LOW (ref 0.7–4.0)
MCH: 27.9 pg (ref 26.0–34.0)
MCHC: 33.6 g/dL (ref 30.0–36.0)
MCV: 83.1 fL (ref 80.0–100.0)
Monocytes Absolute: 0.4 10*3/uL (ref 0.1–1.0)
Monocytes Relative: 17 %
Neutro Abs: 1.7 10*3/uL (ref 1.7–7.7)
Neutrophils Relative %: 66 %
Platelets: 56 10*3/uL — ABNORMAL LOW (ref 150–400)
RBC: 4.08 MIL/uL — ABNORMAL LOW (ref 4.22–5.81)
RDW: 15.1 % (ref 11.5–15.5)
WBC: 2.5 10*3/uL — ABNORMAL LOW (ref 4.0–10.5)
nRBC: 0 % (ref 0.0–0.2)

## 2021-09-16 LAB — BASIC METABOLIC PANEL
Anion gap: 8 (ref 5–15)
BUN: 6 mg/dL (ref 6–20)
CO2: 25 mmol/L (ref 22–32)
Calcium: 8.1 mg/dL — ABNORMAL LOW (ref 8.9–10.3)
Chloride: 102 mmol/L (ref 98–111)
Creatinine, Ser: 1.07 mg/dL (ref 0.61–1.24)
GFR, Estimated: >60 mL/min (ref >60)
Glucose, Bld: 186 mg/dL — ABNORMAL HIGH (ref 70–99)
Potassium: 3.1 mmol/L — ABNORMAL LOW (ref 3.5–5.1)
Sodium: 135 mmol/L (ref 135–145)

## 2021-09-16 LAB — MRSA NEXT GEN BY PCR, NASAL: MRSA by PCR Next Gen: NOT DETECTED

## 2021-09-16 MED ORDER — POTASSIUM CHLORIDE CRYS ER 20 MEQ PO TBCR
60.0000 meq | EXTENDED_RELEASE_TABLET | Freq: Once | ORAL | Status: AC
  Administered 2021-09-16: 60 meq via ORAL
  Filled 2021-09-16: qty 3

## 2021-09-16 MED ORDER — HYDROMORPHONE HCL 1 MG/ML IJ SOLN
1.0000 mg | INTRAMUSCULAR | Status: DC | PRN
  Administered 2021-09-16 – 2021-09-21 (×12): 1 mg via INTRAVENOUS
  Filled 2021-09-16 (×12): qty 1

## 2021-09-16 MED ORDER — IOHEXOL 300 MG/ML  SOLN
80.0000 mL | Freq: Once | INTRAMUSCULAR | Status: AC | PRN
  Administered 2021-09-16: 80 mL via INTRAVENOUS

## 2021-09-16 MED ORDER — SODIUM CHLORIDE 0.9 % IV SOLN
3.0000 g | Freq: Four times a day (QID) | INTRAVENOUS | Status: DC
  Administered 2021-09-16 – 2021-09-21 (×19): 3 g via INTRAVENOUS
  Filled 2021-09-16 (×19): qty 8

## 2021-09-16 NOTE — Progress Notes (Signed)
?PROGRESS NOTE ? ?Calvin Moore  HYI:[id number] DOB: 09-Jan-1996 DOA: 09/13/2021 ?PCP: Patient, No Pcp Per (Inactive)  ? ?Brief Narrative: ?Patient is a 26 year old male with history of chronic alcohol abuse who presented to the emergency department with complaints of right-sided chest pain, right upper quadrant pain.  Pain was worse with deep inspiration/coughing, he was also complaining of shortness of breath.  Chest imaging showed multifocal pneumonia.  Currently on IV antibiotics.  Respiratory status is improving but he again became febrile this mrng.  Checking CT chest ? ?Assessment & Plan: ? ?Principal Problem: ?  Multifocal pneumonia ?Active Problems: ?  Pleural effusion ?  Splenomegaly ?  Thrombocytopenia (HCC) ?  Alcohol use ?  Hospital acquired PNA ? ? ?Assessment and Plan: ?* Multifocal pneumonia ?Presented with shortness of breath, cough, right-sided chest pain, right upper quadrant pain.  CT chest shows right upper lobe consolidation and patchy right lower lobe airspace disease suggesting multifocal pneumonia. Also showed ill-defined nodular densities in the left upper lobe likely infectious/inflammatory. Follow-up imaging recommended in 3 months. ?Patient has history of heavy alcohol use, aspiration pneumonia is a possibility.  Continue current antibiotics. ?His chest pain is pleuritic in nature and is associated with pneumonia. ?Follow-up chest x-ray showed stable right upper consolidation, small pleural effusion. ?Continue current antibiotics.Blood cultures ordered,NGTD. ?He is persistently febrile.  Will check CT chest with contrast to rule out lung abscess or empyema.  Continue Dilaudid for severe pleuritic chest pain.  No leukocytosis, blood pressure stable ? ?Alcohol use ?Drinks heavily as per the brother.  Started on CIWA protocol.  Continue thiamine and folic acid.  Right upper quadrant ultrasound shows possible cirrhosis. Counseled for cessation . ? ?Thrombocytopenia (HCC) ?Likely associated  with splenomegaly/cirrhosis .  Continue to monitor ? ?Splenomegaly ?Likely alcohol related.  Has thrombocytopenia ? ?Pleural effusion ?Looks like small parapneumonic effusion associated with PNA ? ? ? ? ? ? ?  ?  ? ?DVT prophylaxis:Place and maintain sequential compression device Start: 09/14/21 0341 ? ? ?  Code Status: Full Code ? ?Family Communication: Mother at bedside on 3/23 ? ?Patient status:Inpatient  ? ?Patient is from :Home ? ?Anticipated discharge NO:[facility] ? ?Estimated DC date:1-2 days ? ? ?Consultants: None ? ?Procedures:None ? ?Antimicrobials:  ?Anti-infectives (From admission, onward)  ? ? Start     Dose/Rate Route Frequency Ordered Stop  ? 09/14/21 2200  cefTRIAXone (ROCEPHIN) 2 g in sodium chloride 0.9 % 100 mL IVPB       ? 2 g ?200 mL/hr over 30 Minutes Intravenous Every 24 hours 09/13/21 2233 09/18/21 2159  ? 09/14/21 2200  azithromycin (ZITHROMAX) 500 mg in sodium chloride 0.9 % 250 mL IVPB       ? 500 mg ?250 mL/hr over 60 Minutes Intravenous Every 24 hours 09/13/21 2233 09/18/21 2159  ? 09/13/21 2015  cefTRIAXone (ROCEPHIN) 1 g in sodium chloride 0.9 % 100 mL IVPB       ? 1 g ?200 mL/hr over 30 Minutes Intravenous  Once 09/13/21 2012 09/13/21 2143  ? 09/13/21 2015  azithromycin (ZITHROMAX) 500 mg in sodium chloride 0.9 % 250 mL IVPB       ? 500 mg ?250 mL/hr over 60 Minutes Intravenous  Once 09/13/21 2012 09/13/21 2244  ? ?  ? ? ?Subjective: ? ?Patient seen and examined at bedside this morning.  Febrile this morning.  Blood pressure stable.  Continues to complain of pain on the right chest with cough and deep breathing. ? ?Objective: ?Vitals:  ? 09/15/21  1829 09/15/21 2058 09/16/21 0100 09/16/21 0805  ?BP: (!) 147/89 135/85 (!) 139/92 (!) 149/99  ?Pulse: 95 98 80 84  ?Resp: (!) 40 20 19 18   ?Temp: (!) 103 ?F (39.4 ?C) (!) 102 ?F (38.9 ?C) (!) 100.5 ?F (38.1 ?C) (!) 101 ?F (38.3 ?C)  ?TempSrc: Oral  Oral Oral  ?SpO2: 93% 94% 93% 94%  ?Weight:      ?Height:      ? ? ?Intake/Output Summary (Last 24  hours) at 09/16/2021 1118 ?Last data filed at 09/15/2021 1500 ?Gross per 24 hour  ?Intake 800 ml  ?Output --  ?Net 800 ml  ? ?Filed Weights  ? 09/14/21 0305  ?Weight: 78.9 kg  ? ? ?Examination: ? ?General exam: Overall comfortable, not in distress ?HEENT: PERRL ?Respiratory system: Mildly diminished air entry on the right side ,no wheezes or crackles  ?Cardiovascular system: S1 & S2 heard, RRR.  ?Gastrointestinal system: Abdomen is nondistended, soft and nontender. ?Central nervous system: Alert and oriented ?Extremities: No edema, no clubbing ,no cyanosis ?Skin: No rashes, no ulcers,no icterus   ? ? ?Data Reviewed: I have personally reviewed following labs and imaging studies ? ?CBC: ?Recent Labs  ?Lab 09/13/21 ?1647 09/14/21 ?0036 09/14/21 ?0503 09/16/21 ?0404  ?WBC 9.1 7.6  --  2.5*  ?NEUTROABS 7.4  --   --  1.7  ?HGB 13.7 12.8*  --  11.4*  ?HCT 42.8 38.8*  --  33.9*  ?MCV 85.4 85.3  --  83.1  ?PLT PLATELET CLUMPS NOTED ON SMEAR, UNABLE TO ESTIMATE 63* 64* 56*  ? ?Basic Metabolic Panel: ?Recent Labs  ?Lab 09/13/21 ?1647 09/14/21 ?0036 09/16/21 ?0404  ?NA 136 134* 135  ?K 3.7 3.5 3.1*  ?CL 98 99 102  ?CO2 27 26 25   ?GLUCOSE 121* 104* 186*  ?BUN 13 11 6   ?CREATININE 1.16 1.13 1.07  ?CALCIUM 9.3 8.8* 8.1*  ? ? ? ?Recent Results (from the past 240 hour(s))  ?Resp Panel by RT-PCR (Flu A&B, Covid) Nasopharyngeal Swab     Status: None  ? Collection Time: 09/14/21 12:15 AM  ? Specimen: Nasopharyngeal Swab; Nasopharyngeal(NP) swabs in vial transport medium  ?Result Value Ref Range Status  ? SARS Coronavirus 2 by RT PCR NEGATIVE NEGATIVE Final  ?  Comment: (NOTE) ?SARS-CoV-2 target nucleic acids are NOT DETECTED. ? ?The SARS-CoV-2 RNA is generally detectable in upper respiratory ?specimens during the acute phase of infection. The lowest ?concentration of SARS-CoV-2 viral copies this assay can detect is ?138 copies/mL. A negative result does not preclude SARS-Cov-2 ?infection and should not be used as the sole basis for  treatment or ?other patient management decisions. A negative result may occur with  ?improper specimen collection/handling, submission of specimen other ?than nasopharyngeal swab, presence of viral mutation(s) within the ?areas targeted by this assay, and inadequate number of viral ?copies(<138 copies/mL). A negative result must be combined with ?clinical observations, patient history, and epidemiological ?information. The expected result is Negative. ? ?Fact Sheet for Patients:  ? ? ?Fact Sheet for Healthcare Providers:  ? ? ?This test is no t yet approved or cleared by the 09/16/21 FDA and  ?has been authorized for detection and/or diagnosis of SARS-CoV-2 by ?FDA under an Emergency Use Authorization (EUA). This EUA will remain  ?in effect (meaning this test can be used) for the duration of the ?COVID-19 declaration under Section 564(b)(1) of the Act, 21 ?U.S.C.section 360bbb-3(b)(1), unless the authorization is terminated  ?or revoked sooner.  ? ? ?  ?  Influenza A by PCR NEGATIVE NEGATIVE Final  ? Influenza B by PCR NEGATIVE NEGATIVE Final  ?  Comment: (NOTE) ?The Xpert Xpress SARS-CoV-2/FLU/RSV plus assay is intended as an aid ?in the diagnosis of influenza from Nasopharyngeal swab specimens and ?should not be used as a sole basis for treatment. Nasal washings and ?aspirates are unacceptable for Xpert Xpress SARS-CoV-2/FLU/RSV ?testing. ? ?Fact Sheet for Patients: ?BloggerCourse.comhttps://www.fda.gov/media/152166/download ? ?Fact Sheet for Healthcare Providers: ?SeriousBroker.ithttps://www.fda.gov/media/152162/download ? ?This test is not yet approved or cleared by the [place] FDA and ?has been authorized for detection and/or diagnosis of SARS-CoV-2 by ?FDA under an Emergency Use Authorization (EUA). This EUA will remain ?in effect (meaning this test can be used) for the duration of the ?COVID-19 declaration under Section 564(b)(1) of the Act, 21  U.S.C. ?section 360bbb-3(b)(1), unless the authorization is terminated or ?revoked. ? ?Performed at [facility], [address] [place], [place] ?[id number] ?  ?Respiratory (~20 pathogens) panel by PCR

## 2021-09-16 NOTE — Consult Note (Signed)
? ?NAME:  Calvin Moore, MRN:  [id number], DOB:  03-06-1996, LOS: 2 ?ADMISSION DATE:  09/13/2021, CONSULTATION DATE:  09/16/21 ?REFERRING MD:  Dr. Renford Dills, CHIEF COMPLAINT:  09/16/21  ? ?History of Present Illness:  ? ?26 year old male with prior hx of heavy ETOH abuse who presented on 3/21 with two day history of progressively worse right sided chest pain and RUQ abdominal pain with associated shortness of breath.  CTA PE neg but showed multifocal pneumonia with small partially loculated right pleural effusion.  Additionally found to be thrombocytopenic with splenomegaly and suspected cirrhosis. Admitted to [facility] and started on azithromycin and ceftriaxone.  Patient continued to be febrile, therefore CT chest was rechecked which showed worsening multifocal pneumonia, right worse than left, and suggestive of pulmonary necrosis in right upper lobe with increased loculated pleural effusion, moderate size now.  He remains on room air.  Pulmonary consulted for further recommendations.  ? ?Pertinent  Medical History  ?ETOH abuse  ? ?Significant Hospital Events: ?Including procedures, antibiotic start and stop dates in addition to other pertinent events   ?3/21 admitted [facility] w/ multifocal pna, small partially loculated R effusion, thrombocytopenia, splenomegaly on azithro/ ceftriaxone  ?3/24 still febrile, repeat CT chest> worsening multifocal pna R>L with concern for RUL abscess, mod loculated R effusion ? ?3/21 azithro/ ceftriaxone > 3/24 ? ? ?3/21 SARS/ flu > neg ?3/22 RVP > neg ?3/23 BC >  ? ? ?3/21 CTA PE >  ?1. No evidence for pulmonary embolism. ?2. Borderline cardiomegaly. ?3. Small right pleural effusion, partially loculated which can be ?seen in the setting of infection. ?4. Trace left pleural effusion. ?5. Right upper lobe consolidation and patchy right lower lobe ?airspace disease worrisome for multifocal pneumonia. Follow-up ?imaging recommended to confirm resolution. ?6. Ill-defined nodular densities in the  left upper lobe are favored ?as infectious/inflammatory. Follow-up imaging recommended in 3 ?months to re-evaluate. ?7. Prominent right hilar lymph nodes, likely reactive. ?8. Moderate severe splenomegaly. ?9. Small amount of free fluid in the right upper quadrant and ?pelvis. ?10. Umbilical hernia containing bowel.  No bowel obstruction. ?11. Gastric wall thickening versus normal under distension. ?Correlate for gastritis. ?12. Gallbladder sludge versus small stones. ? ?3/24 CT chest >  ?1. Mildly motion degraded exam. ?2. Slightly worsening right greater than left pneumonia. Minimal ?extraalveolar air in the posterior right upper lobe suggests ?pulmonary necrosis. ?3. Moderate locular right pleural effusion, increased. ?4. Suspect concurrent small to moderate pericardial effusion ?adjacent to loculated medial right pleural fluid. ?5. Cirrhosis, incompletely imaged. ?6. Similar trace left pleural fluid. ? ?Interim History / Subjective:  ? ?Continues to have cough and some right flank pain, some sputum production ?No dyspnea ? ?Objective   ?Blood pressure (!) 137/91, pulse 86, temperature (!) 102.7 ?F (39.3 ?C), temperature source Oral, resp. rate 17, height 5\' 7"  (1.702 m), weight 78.9 kg, SpO2 91 %. ?   ?   ?No intake or output data in the 24 hours ending 09/16/21 1632 ?Filed Weights  ? 09/14/21 0305  ?Weight: 78.9 kg  ? ? ?Examination: ?General: Well-developed gentleman sitting up in bed, frequently coughing ?HEENT: MM pink/moist ?Neuro: Awake, alert, interacting appropriately via translator.  No focal deficits noted ?CV: s1s2, no m/r/g ?PULM: Rhonchi throughout on the right, left is clear ?GI: soft, bsx4 active  ?Extremities: warm/dry, no edema  ?Skin: no rashes or lesions ? ? ?Resolved Hospital Problem list   ? ?Assessment & Plan:  ? ?Multifocal Pneumonia right-sided pneumonia involving most of the right upper  lobe, also the right lower lobe.  CT imaging consistent with early abscess formation concerning for  possible anaerobic infection.  Certainly possible and patient with a history of heavy alcohol use. ?Loculated right pleural effusion consistent with a complicated pleural space, likely parapneumonic versus empyema.  He has continued fever and is a high risk for empyema ? ?Recommendations: ?-Will change antibiotics to Unasyn to better cover anaerobes ?-Send sputum for culture, bacterial, AFB ?-Check [name] ?-Discussed with the patient and his mother via Investment [employer], corporate that he would benefit from sampling of his right pleural fluid and probably full drainage with a pigtail catheter.  They are going to speak with other family and then decide whether to pursue this.  We will follow-up with him and arrange if he agrees to proceed. ? ? ?Labs   ?CBC: ?Recent Labs  ?Lab 09/13/21 ?1647 09/14/21 ?0036 09/14/21 ?0503 09/16/21 ?0404  ?WBC 9.1 7.6  --  2.5*  ?NEUTROABS 7.4  --   --  1.7  ?HGB 13.7 12.8*  --  11.4*  ?HCT 42.8 38.8*  --  33.9*  ?MCV 85.4 85.3  --  83.1  ?PLT PLATELET CLUMPS NOTED ON SMEAR, UNABLE TO ESTIMATE 63* 64* 56*  ? ? ?Basic Metabolic Panel: ?Recent Labs  ?Lab 09/13/21 ?1647 09/14/21 ?0036 09/16/21 ?0404  ?NA 136 134* 135  ?K 3.7 3.5 3.1*  ?CL 98 99 102  ?CO2 27 26 25   ?GLUCOSE 121* 104* 186*  ?BUN 13 11 6   ?CREATININE 1.16 1.13 1.07  ?CALCIUM 9.3 8.8* 8.1*  ? ?GFR: ?Estimated Creatinine Clearance: 97 mL/min (by C-G formula based on SCr of 1.07 mg/dL). ?Recent Labs  ?Lab 09/13/21 ?1647 09/14/21 ?0034 09/14/21 ?0036 09/14/21 ?0503 09/16/21 ?0404  ?PROCALCITON  --  2.49  --   --   --   ?WBC 9.1  --  7.6  --  2.5*  ?LATICACIDVEN  --  1.7  --  1.4  --   ? ? ?Liver Function Tests: ?Recent Labs  ?Lab 09/13/21 ?1647  ?AST 26  ?ALT 34  ?ALKPHOS 39  ?BILITOT 1.4*  ?PROT 7.4  ?ALBUMIN 3.5  ? ?Recent Labs  ?Lab 09/13/21 ?1647  ?LIPASE 27  ? ?No results for input(s): AMMONIA in the last 168 hours. ? ?ABG ?No results found for: PHART, PCO2ART, PO2ART, HCO3, TCO2, ACIDBASEDEF, O2SAT  ? ?Coagulation  Profile: ?Recent Labs  ?Lab 09/14/21 ?0503  ?INR 1.5*  ? ? ?Cardiac Enzymes: ?No results for input(s): CKTOTAL, CKMB, CKMBINDEX, TROPONINI in the last 168 hours. ? ?HbA1C: ?No results found for: HGBA1C ? ?CBG: ?No results for input(s): GLUCAP in the last 168 hours. ? ?Review of Systems:   ?As per HPI ? ?Past Medical History:  ?He,  has no past medical history on file.  ? ?Surgical History:  ?History reviewed. No pertinent surgical history.  ? ?Social History:  ? reports that he has never smoked. He has never used smokeless tobacco. He reports current alcohol use. He reports that he does not use drugs.  ? ?Family History:  ?His family history is not on file.  ? ?Allergies ?No Known Allergies  ? ?Home Medications  ?Prior to Admission medications   ?Not on File  ?  ? ?Critical care time: NA ?  ? ? ? ?09/15/21, MD, PhD ?09/16/2021, 5:54 PM ?[facility] ?[phone] or if no answer before 7:00PM call [phone] ?For any issues after 7:00PM please call eLink [phone] ? ? ?

## 2021-09-16 NOTE — Progress Notes (Signed)
?   09/16/21 1621  ?Assess: MEWS Score  ?Temp (!) 102.7 ?F (39.3 ?C) ?(nurse noitfy)  ?BP (!) 137/91  ?Pulse Rate 86  ?ECG Heart Rate 90  ?Resp 17  ?SpO2 91 %  ?O2 Device Room Air  ?Assess: MEWS Score  ?MEWS Temp 2  ?MEWS Systolic 0  ?MEWS Pulse 0  ?MEWS RR 0  ?MEWS LOC 0  ?MEWS Score 2  ?MEWS Score Color Yellow  ?Assess: if the MEWS score is Yellow or Red  ?Were vital signs taken at a resting state? Yes  ?Focused Assessment No change from prior assessment  ?Early Detection of Sepsis Score *See Row Information* Medium  ?MEWS guidelines implemented *See Row Information* Yes  ?Treat  ?MEWS Interventions Administered prn meds/treatments  ?Take Vital Signs  ?Increase Vital Sign Frequency  Yellow: Q 2hr X 2 then Q 4hr X 2, if remains yellow, continue Q 4hrs  ?Escalate  ?MEWS: Escalate Yellow: discuss with charge nurse/RN and consider discussing with provider and RRT  ?Notify: Charge Nurse/RN  ?Name of Charge Nurse/RN Notified Roj, RN  ?Date Charge Nurse/RN Notified 09/16/21  ?Time Charge Nurse/RN Notified 1530  ?Notify: Provider  ?Provider Name/Title Dr. Solon Augusta  ?Date Provider Notified 09/16/21  ?Time Provider Notified 1630  ?Notification Type Face-to-face  ?Notification Reason Other (Comment) ?(Fever)  ?Provider response At bedside  ?Date of Provider Response 09/16/21  ?Time of Provider Response 1630  ?Document  ?Patient Outcome Other (Comment) ?(Pt. still febrile.)  ?Progress note created (see row info) Yes  ? ? ?

## 2021-09-17 ENCOUNTER — Inpatient Hospital Stay (HOSPITAL_COMMUNITY): Payer: BC Managed Care – PPO

## 2021-09-17 DIAGNOSIS — E876 Hypokalemia: Secondary | ICD-10-CM

## 2021-09-17 DIAGNOSIS — J90 Pleural effusion, not elsewhere classified: Secondary | ICD-10-CM

## 2021-09-17 LAB — CBC WITH DIFFERENTIAL/PLATELET
Abs Immature Granulocytes: 0.04 10*3/uL (ref 0.00–0.07)
Basophils Absolute: 0 10*3/uL (ref 0.0–0.1)
Basophils Relative: 1 %
Eosinophils Absolute: 0 10*3/uL (ref 0.0–0.5)
Eosinophils Relative: 0 %
HCT: 35.5 % — ABNORMAL LOW (ref 39.0–52.0)
Hemoglobin: 11.8 g/dL — ABNORMAL LOW (ref 13.0–17.0)
Immature Granulocytes: 1 %
Lymphocytes Relative: 19 %
Lymphs Abs: 0.7 10*3/uL (ref 0.7–4.0)
MCH: 27.5 pg (ref 26.0–34.0)
MCHC: 33.2 g/dL (ref 30.0–36.0)
MCV: 82.8 fL (ref 80.0–100.0)
Monocytes Absolute: 0.5 10*3/uL (ref 0.1–1.0)
Monocytes Relative: 14 %
Neutro Abs: 2.4 10*3/uL (ref 1.7–7.7)
Neutrophils Relative %: 65 %
Platelets: 67 10*3/uL — ABNORMAL LOW (ref 150–400)
RBC: 4.29 MIL/uL (ref 4.22–5.81)
RDW: 15.5 % (ref 11.5–15.5)
WBC: 3.7 10*3/uL — ABNORMAL LOW (ref 4.0–10.5)
nRBC: 0 % (ref 0.0–0.2)

## 2021-09-17 LAB — BASIC METABOLIC PANEL
Anion gap: 9 (ref 5–15)
BUN: 5 mg/dL — ABNORMAL LOW (ref 6–20)
CO2: 21 mmol/L — ABNORMAL LOW (ref 22–32)
Calcium: 8.4 mg/dL — ABNORMAL LOW (ref 8.9–10.3)
Chloride: 105 mmol/L (ref 98–111)
Creatinine, Ser: 0.92 mg/dL (ref 0.61–1.24)
GFR, Estimated: >60 mL/min (ref >60)
Glucose, Bld: 101 mg/dL — ABNORMAL HIGH (ref 70–99)
Potassium: 3.7 mmol/L (ref 3.5–5.1)
Sodium: 135 mmol/L (ref 135–145)

## 2021-09-17 LAB — STREP PNEUMONIAE URINARY ANTIGEN: Strep Pneumo Urinary Antigen: NEGATIVE

## 2021-09-17 LAB — PROTIME-INR
INR: 1.3 — ABNORMAL HIGH (ref 0.8–1.2)
Prothrombin Time: 15.7 seconds — ABNORMAL HIGH (ref 11.4–15.2)

## 2021-09-17 LAB — PROTEIN, TOTAL: Total Protein: 5.9 g/dL — ABNORMAL LOW (ref 6.5–8.1)

## 2021-09-17 LAB — LACTATE DEHYDROGENASE: LDH: 196 U/L — ABNORMAL HIGH (ref 98–192)

## 2021-09-17 MED ORDER — FENTANYL CITRATE (PF) 100 MCG/2ML IJ SOLN
INTRAMUSCULAR | Status: AC
  Filled 2021-09-17: qty 4

## 2021-09-17 MED ORDER — MIDAZOLAM HCL 2 MG/2ML IJ SOLN
INTRAMUSCULAR | Status: AC
  Filled 2021-09-17: qty 6

## 2021-09-17 MED ORDER — LIDOCAINE HCL 1 % IJ SOLN
INTRAMUSCULAR | Status: AC
  Filled 2021-09-17: qty 10

## 2021-09-17 MED ORDER — FENTANYL CITRATE (PF) 100 MCG/2ML IJ SOLN
INTRAMUSCULAR | Status: AC | PRN
  Administered 2021-09-17 (×2): 50 ug via INTRAVENOUS

## 2021-09-17 MED ORDER — MIDAZOLAM HCL 2 MG/2ML IJ SOLN
INTRAMUSCULAR | Status: AC | PRN
  Administered 2021-09-17 (×2): 1 mg via INTRAVENOUS

## 2021-09-17 NOTE — Progress Notes (Signed)
?PROGRESS NOTE ? ?Calvin Moore  [id number] DOB: 1995/11/13 DOA: 09/13/2021 ?PCP: Patient, No Pcp Per (Inactive)  ? ?Brief Narrative: ?Patient is a 26 year old male with history of chronic alcohol abuse who presented to the emergency department with complaints of right-sided chest pain, right upper quadrant pain.  Pain was worse with deep inspiration/coughing, he was also complaining of shortness of breath.  Chest imaging showed multifocal pneumonia.  Started on IV antibiotics for CAP.  Hospital course remarkable for persistent fever, though he does not require oxygen.  CT chest done on 3/24 showed worsening pneumonia,possible  posterior right upper lobe pulmonary necrosis,moderate locular right pleural effusion, increased.  PCCM consulted.  Plan for chest tube placement ? ?Assessment & Plan: ? ?Principal Problem: ?  Multifocal pneumonia ?Active Problems: ?  Pericardial effusion ?  Splenomegaly ?  Thrombocytopenia ([place]) ?  Alcohol use ?  Hospital acquired PNA ?  Hypokalemia ? ? ?Assessment and Plan: ?* Multifocal pneumonia ?Presented with shortness of breath, cough, right-sided chest pain, right upper quadrant pain.  CT chest on admission showed right upper lobe consolidation and patchy right lower lobe airspace disease suggesting multifocal pneumonia. Also showed ill-defined nodular densities in the left upper lobe likely infectious/inflammatory. ?Patient has history of heavy alcohol use, aspiration pneumonia is a possibility.His chest pain is pleuritic in nature and is associated with pneumonia. ?Follow-up chest x-ray showed stable right upper consolidation, small pleural effusion.Blood cultures ordered,NGTD. ?He remained persistently febrile.Started on IV antibiotics for CAP.  Hospital course remarkable for persistent fever, though he does not require oxygen.  CT chest done on 3/24 showed worsening pneumonia,possible  posterior right upper lobe pulmonary necrosis,moderate locular right pleural effusion,  increased.  PCCM consulted.  Plan for chest tube placement. ?Continue Dilaudid for severe pleuritic chest pain.  No leukocytosis, blood pressure stable ? ?Alcohol use ?Drinks heavily as per the brother.  Started on CIWA protocol.  Continue thiamine and folic acid.  Right upper quadrant ultrasound shows possible cirrhosis. Counseled for cessation . ? ?Thrombocytopenia ([place]) ?Likely associated with splenomegaly/cirrhosis .  Continue to monitor ? ?Splenomegaly ?Likely alcohol related.  Has thrombocytopenia ? ?Pericardial effusion ?Chest CT showed small to moderate pericardial effusion.  Patient is asymptomatic.  Will check echocardiogram ? ?Hypokalemia ?Supplemented with potassium ? ? ? ? ? ? ?  ?  ? ?DVT prophylaxis:Place and maintain sequential compression device Start: 09/14/21 0341 ? ? ?  Code Status: Full Code ? ?Family Communication: Family  at bedside ? ?Patient status:Inpatient  ? ?Patient is from :Home ? ?Anticipated discharge NE:[id number] ? ?Estimated DC date:after chest tube removal ? ? ?Consultants: None ? ?Procedures:None ? ?Antimicrobials:  ?Anti-infectives (From admission, onward)  ? ? Start     Dose/Rate Route Frequency Ordered Stop  ? 09/16/21 2000  Ampicillin-Sulbactam (UNASYN) 3 g in sodium chloride 0.9 % 100 mL IVPB       ? 3 g ?200 mL/hr over 30 Minutes Intravenous Every 6 hours 09/16/21 1751 09/21/21 1959  ? 09/14/21 2200  cefTRIAXone (ROCEPHIN) 2 g in sodium chloride 0.9 % 100 mL IVPB  Status:  Discontinued       ? 2 g ?200 mL/hr over 30 Minutes Intravenous Every 24 hours 09/13/21 2233 09/16/21 1747  ? 09/14/21 2200  azithromycin (ZITHROMAX) 500 mg in sodium chloride 0.9 % 250 mL IVPB  Status:  Discontinued       ? 500 mg ?250 mL/hr over 60 Minutes Intravenous Every 24 hours 09/13/21 2233 09/16/21 1747  ? 09/13/21 2015  cefTRIAXone (ROCEPHIN)  1 g in sodium chloride 0.9 % 100 mL IVPB       ? 1 g ?200 mL/hr over 30 Minutes Intravenous  Once 09/13/21 2012 09/13/21 2143  ? 09/13/21 2015  azithromycin  (ZITHROMAX) 500 mg in sodium chloride 0.9 % 250 mL IVPB       ? 500 mg ?250 mL/hr over 60 Minutes Intravenous  Once 09/13/21 2012 09/13/21 2244  ? ?  ? ? ?Subjective: ? ?Patient seen and examined at bedside this morning.  Hemodynamically stable during my evaluation.  Does not look like in distress, on room air.  He was again febrile this morning.  PCCM and IR were at bedside discussing about chest tube placement ? ?Objective: ?Vitals:  ? 09/16/21 1744 09/16/21 1830 09/16/21 2051 09/17/21 0711  ?BP: 132/73  125/71 (!) 150/86  ?Pulse: 95  96 92  ?Resp: 18  18 18   ?Temp: (!) 102.9 ?F (39.4 ?C) (!) 102.8 ?F (39.3 ?C) (!) 101.6 ?F (38.7 ?C) (!) 101.9 ?F (38.8 ?C)  ?TempSrc:  Oral Oral Oral  ?SpO2: 96%  93% 92%  ?Weight:      ?Height:      ? ? ?Intake/Output Summary (Last 24 hours) at 09/17/2021 1012 ?Last data filed at 09/16/2021 2200 ?Gross per 24 hour  ?Intake 1704 ml  ?Output 300 ml  ?Net 1404 ml  ? ?Filed Weights  ? 09/14/21 0305  ?Weight: 78.9 kg  ? ? ?Examination: ? ?General exam: Overall comfortable, not in distress ?HEENT: PERRL ?Respiratory system: Diminished sounds on the right side ?Cardiovascular system: S1 & S2 heard, RRR.  ?Gastrointestinal system: Abdomen is nondistended, soft and nontender. ?Central nervous system: Alert and oriented ?Extremities: No edema, no clubbing ,no cyanosis ?Skin: No rashes, no ulcers,no icterus   ? ? ?Data Reviewed: I have personally reviewed following labs and imaging studies ? ?CBC: ?Recent Labs  ?Lab 09/13/21 ?1647 09/14/21 ?0036 09/14/21 ?0503 09/16/21 ?0404  ?WBC 9.1 7.6  --  2.5*  ?NEUTROABS 7.4  --   --  1.7  ?HGB 13.7 12.8*  --  11.4*  ?HCT 42.8 38.8*  --  33.9*  ?MCV 85.4 85.3  --  83.1  ?PLT PLATELET CLUMPS NOTED ON SMEAR, UNABLE TO ESTIMATE 63* 64* 56*  ? ?Basic Metabolic Panel: ?Recent Labs  ?Lab 09/13/21 ?1647 09/14/21 ?0036 09/16/21 ?0404  ?NA 136 134* 135  ?K 3.7 3.5 3.1*  ?CL 98 99 102  ?CO2 27 26 25   ?GLUCOSE 121* 104* 186*  ?BUN 13 11 6   ?CREATININE 1.16 1.13 1.07   ?CALCIUM 9.3 8.8* 8.1*  ? ? ? ?Recent Results (from the past 240 hour(s))  ?Resp Panel by RT-PCR (Flu A&B, Covid) Nasopharyngeal Swab     Status: None  ? Collection Time: 09/14/21 12:15 AM  ? Specimen: Nasopharyngeal Swab; Nasopharyngeal(NP) swabs in vial transport medium  ?Result Value Ref Range Status  ? SARS Coronavirus 2 by RT PCR NEGATIVE NEGATIVE Final  ?  Comment: (NOTE) ?SARS-CoV-2 target nucleic acids are NOT DETECTED. ? ?The SARS-CoV-2 RNA is generally detectable in upper respiratory ?specimens during the acute phase of infection. The lowest ?concentration of SARS-CoV-2 viral copies this assay can detect is ?138 copies/mL. A negative result does not preclude SARS-Cov-2 ?infection and should not be used as the sole basis for treatment or ?other patient management decisions. A negative result may occur with  ?improper specimen collection/handling, submission of specimen other ?than nasopharyngeal swab, presence of viral mutation(s) within the ?areas targeted by this assay, and inadequate  number of viral ?copies(<138 copies/mL). A negative result must be combined with ?clinical observations, patient history, and epidemiological ?information. The expected result is Negative. ? ?Fact Sheet for Patients:  ?EntrepreneurPulse.com.au ? ?Fact Sheet for Healthcare Providers:  ?IncredibleEmployment.be ? ?This test is no t yet approved or cleared by the [place] FDA and  ?has been authorized for detection and/or diagnosis of SARS-CoV-2 by ?FDA under an Emergency Use Authorization (EUA). This EUA will remain  ?in effect (meaning this test can be used) for the duration of the ?COVID-19 declaration under Section 564(b)(1) of the Act, 21 ?U.S.C.section 360bbb-3(b)(1), unless the authorization is terminated  ?or revoked sooner.  ? ? ?  ? Influenza A by PCR NEGATIVE NEGATIVE Final  ? Influenza B by PCR NEGATIVE NEGATIVE Final  ?  Comment: (NOTE) ?The Xpert Xpress SARS-CoV-2/FLU/RSV  plus assay is intended as an aid ?in the diagnosis of influenza from Nasopharyngeal swab specimens and ?should not be used as a sole basis for treatment. Nasal washings and ?aspirates are unacceptable for Xper

## 2021-09-17 NOTE — Consult Note (Signed)
? ?Chief Complaint: Patient was seen in consultation today for right chest tube placement. ? ?Referring Physician(s): Canary Brimllis, Brandi, NP ? ?Supervising Physician: Mir, Mauri ReadingFarhaan ? ?[facility] ? ?History of Present Illness: ?Calvin GurneyHenry Moore is a 26 y.o. male with a past medical history significant for ETOH abuse who [facility] with complaints of right chest and right upper abdominal pain since Sunday. CTA chest was obtained for possible which showed no PE but did note a small partially loculated right pleural effusion, right upper lobe consolidation consistent with multifocal pneumonia. He was admitted and started on empiric IV antibiotics. He has continued to experience intermittent fevers so a CT chest w/contrast was obtained 3/24 which showed: ? ?1. Mildly motion degraded exam. ?2. Slightly worsening right greater than left pneumonia. Minimal ?extraalveolar air in the posterior right upper lobe suggests ?pulmonary necrosis. ?3. Moderate locular right pleural effusion, increased. ?4. Suspect concurrent small to moderate pericardial effusion ?adjacent to loculated medial right pleural fluid. ?5. Cirrhosis, incompletely imaged. ?6. Similar trace left pleural fluid. ?  ?Pulmonology was consulted and recommendation was made for right chest tube placement for fluid sampling and complete drainage. IR has been consulted for right chest tube placement. ? ?History reviewed. No pertinent past medical history. ? ?History reviewed. No pertinent surgical history. ? ?Allergies: ?Patient has no known allergies. ? ?Medications: ?Prior to Admission medications   ?Not on File  ?  ? ?History reviewed. No pertinent family history. ? ?Social History  ? ?Socioeconomic History  ? Marital status: Married  ?  Spouse name: Not on file  ? Number of children: Not on file  ? Years of education: Not on file  ? Highest education level: Not on file  ?Occupational History  ? Not on file  ?Tobacco Use  ?  Smoking status: Never  ? Smokeless tobacco: Never  ?Substance and Sexual Activity  ? Alcohol use: Yes  ?  Comment: per pt during celebrations, per brother often almost daily  ? Drug use: No  ? Sexual activity: Not on file  ?Other Topics Concern  ? Not on file  ?Social History Narrative  ? Not on file  ? ?Social Determinants of Health  ? ?Financial Resource Strain: Not on file  ?Food Insecurity: Not on file  ?Transportation Needs: Not on file  ?Physical Activity: Not on file  ?Stress: Not on file  ?Social Connections: Not on file  ? ? ? ?Review of Systems: A 12 point ROS discussed and pertinent positives are indicated in the HPI above.  All other systems are negative. ? ?Review of Systems  ?Constitutional:  Positive for fever.  ?Respiratory:  Positive for cough and shortness of breath.   ?Cardiovascular:  Positive for chest pain.  ?Gastrointestinal:  Positive for abdominal pain. Negative for nausea and vomiting.  ?Musculoskeletal:  Negative for back pain.  ?Neurological:  Negative for dizziness and headaches.  ? ?Vital Signs: ?BP (!) 150/86 (BP Location: Right Arm)   Pulse 92   Temp (!) 101.9 ?F (38.8 ?C) (Oral)   Resp 18   Ht 5\' 7"  (1.702 m)   Wt 173 lb 15.1 oz (78.9 kg)   SpO2 92%   BMI 27.24 kg/m?  ? ?Physical Exam ?Vitals and nursing note reviewed.  ?Constitutional:   ?   General: He is not in acute distress. ?HENT:  ?   Head: Normocephalic.  ?   Mouth/Throat:  ?   Mouth: Mucous membranes are moist.  ?  Pharynx: Oropharynx is clear. No oropharyngeal exudate or posterior oropharyngeal erythema.  ?Cardiovascular:  ?   Rate and Rhythm: Normal rate and regular rhythm.  ?Pulmonary:  ?   Effort: Pulmonary effort is normal.  ?   Comments: Diminished breath sounds on the right, clear on the left ?Mildly tachypneic ?Abdominal:  ?   General: There is no distension.  ?   Palpations: Abdomen is soft.  ?   Tenderness: There is no abdominal tenderness.  ?Musculoskeletal:  ?   Right lower leg: No edema.  ?   Left lower  leg: No edema.  ?Skin: ?   General: Skin is warm and dry.  ?Neurological:  ?   Mental Status: He is alert and oriented to person, place, and time.  ?Psychiatric:     ?   Mood and Affect: Mood normal.     ?   Behavior: Behavior normal.     ?   Thought Content: Thought content normal.     ?   Judgment: Judgment normal.  ? ? ? ?MD Evaluation ?Airway: WNL ?Heart: WNL ?Abdomen: WNL ?Chest/ Lungs: WNL ?ASA  Classification: 3 ?Mallampati/Airway Score: Two ? ? ?Imaging: ?DG Chest 2 View ? ?Result Date: 09/13/2021 ?CLINICAL DATA:  Chest pain. EXAM: CHEST - 2 VIEW COMPARISON:  None. FINDINGS: The heart size and mediastinal contours are within normal limits. Right upper lobe airspace opacity is noted consistent with pneumonia or atelectasis. Left lung is clear. The visualized skeletal structures are unremarkable. IMPRESSION: Right upper lobe airspace opacity is noted consistent with pneumonia or atelectasis. Electronically Signed   By: Lupita Raider M.D.   On: 09/13/2021 10:54  ? ?CT CHEST W CONTRAST ? ?Result Date: 09/16/2021 ?CLINICAL DATA:  Pneumonia.  Lung abscess suspected. EXAM: CT CHEST WITH CONTRAST TECHNIQUE: Multidetector CT imaging of the chest was performed during intravenous contrast administration. RADIATION DOSE REDUCTION: This exam was performed according to the departmental dose-optimization program which includes automated exposure control, adjustment of the mA and/or kV according to patient size and/or use of iterative reconstruction technique. CONTRAST:  81mL OMNIPAQUE IOHEXOL 300 MG/ML  SOLN COMPARISON:  Chest radiograph of 1 day prior. CTA chest of 09/13/2021. FINDINGS: Cardiovascular: Bovine arch. Normal aortic caliber. Mild cardiomegaly. Small to moderate anterior pericardial effusion, adjacent to loculated right-sided pleural fluid. No central pulmonary embolism, on this non-dedicated study. Mediastinum/Nodes: Limited evaluation for mediastinal adenopathy secondary to mediastinal edema and mild motion.  Lungs/Pleura: Some similar trace left pleural fluid. Small to moderate right-sided pleural effusion is slightly increased. Areas of loculation, including posterior medially on 66/3 persist. There is new loculation anteriorly and inferiorly including on 91/3. Minimal focal gas on 56/4 is favored to be extra alveolar and indicative of necrosis. Right lower lobe airspace disease posteriorly is also slightly progressive. Left upper lobe reticulonodular opacities are slightly increased an indicative of infection. Mild motion degradation, especially inferiorly. Upper Abdomen: Moderate cirrhosis. Motion degradation. Incompletely imaged splenomegaly. Grossly normal imaged stomach, right adrenal gland, left kidney. Musculoskeletal: No acute osseous abnormality. IMPRESSION: 1. Mildly motion degraded exam. 2. Slightly worsening right greater than left pneumonia. Minimal extraalveolar air in the posterior right upper lobe suggests pulmonary necrosis. 3. Moderate locular right pleural effusion, increased. 4. Suspect concurrent small to moderate pericardial effusion adjacent to loculated medial right pleural fluid. 5. Cirrhosis, incompletely imaged. 6. Similar trace left pleural fluid. Electronically Signed   By: Jeronimo Greaves M.D.   On: 09/16/2021 16:09  ? ?CT Angio Chest PE W/Cm &/Or Wo Cm ? ?  Result Date: 09/13/2021 ?CLINICAL DATA:  Right lower quadrant pain.  Chest and back pain. EXAM: CT ANGIOGRAPHY CHEST CT ABDOMEN AND PELVIS WITH CONTRAST TECHNIQUE: Multidetector CT imaging of the chest was performed using the standard protocol during bolus administration of intravenous contrast. Multiplanar CT image reconstructions and MIPs were obtained to evaluate the vascular anatomy. Multidetector CT imaging of the abdomen and pelvis was performed using the standard protocol during bolus administration of intravenous contrast. RADIATION DOSE REDUCTION: This exam was performed according to the departmental dose-optimization program which  includes automated exposure control, adjustment of the mA and/or kV according to patient size and/or use of iterative reconstruction technique. CONTRAST:  OMNIPAQUE IOHEXOL 350 MG/ML SOLN COMPARISON:  Non

## 2021-09-17 NOTE — Procedures (Signed)
Interventional Radiology Procedure Note ? ?Procedure: CT guided right chest tube (14 fr) ? ?Indication: Loculated Pleural Effusion ? ?Findings: Please refer to procedural dictation for full description. ? ?Complications: None ? ?EBL: < 10 mL ? ?Acquanetta Belling, MD ?[phone] ? ? ?

## 2021-09-17 NOTE — Assessment & Plan Note (Signed)
Supplemented with potassium. ?

## 2021-09-17 NOTE — Progress Notes (Signed)
Mobility Specialist: Progress Note ? ? 09/17/21 1541  ?Mobility  ?Activity Ambulated independently in hallway  ?Level of Assistance Independent  ?Assistive Device None  ?Distance Ambulated (ft) 1300 ft  ?Activity Response Tolerated well  ?$Mobility charge 1 Mobility  ? ?Pt received in bed and agreeable to ambulation. C/o abdominal pain during session, no rating given. Pt otherwise asymptomatic. Pt back to bed after walk with call bell at his side and family present in the room.  ? ?Cristal Deer Pantelis Elgersma ?Mobility Specialist ?Mobility Specialist [address]: [phone] ?Mobility Specialist [address]: [phone] ? ?

## 2021-09-17 NOTE — Progress Notes (Addendum)
? ?NAME:  Levii Hairfield, MRN:  [id number], DOB:  21-Sep-1995, LOS: 3 ?ADMISSION DATE:  09/13/2021, CONSULTATION DATE:  09/16/21 ?REFERRING MD:  Dr. Renford Dills, CHIEF COMPLAINT:  09/16/21  ? ?History of Present Illness:  ?26 year old male with prior hx of heavy ETOH abuse who presented on 3/21 with two day history of progressively worse right sided chest pain and RUQ abdominal pain with associated shortness of breath.  CTA PE neg but showed multifocal pneumonia with small partially loculated right pleural effusion.  Additionally found to be thrombocytopenic with splenomegaly and suspected cirrhosis. Admitted to [facility] and started on azithromycin and ceftriaxone.  Patient continued to be febrile, therefore CT chest was rechecked which showed worsening multifocal pneumonia, right worse than left, and suggestive of pulmonary necrosis in right upper lobe with increased loculated pleural effusion, moderate size now.  He remains on room air.  Pulmonary consulted for further recommendations.  ? ?Pertinent  Medical History  ?ETOH abuse  ? ?Significant Hospital Events: ?Including procedures, antibiotic start and stop dates in addition to other pertinent events   ?3/21 admitted [facility] w/ multifocal pna, small partially loculated R effusion, thrombocytopenia, splenomegaly on azithro/ ceftriaxone. COVID, flu, RVP negative ?3/24 still febrile, repeat CT chest > worsening multifocal pna R>L with concern for RUL abscess, mod loculated R effusion.  Transitioned to Unasyn.  PCCM consulted.  ? ? ?Interim History / Subjective:  ?Tmax 102.8, s/p tylenol.  WBC 2.5 on 3/24  ?Blood cultured pending  ?On RA  ? ?Objective   ?Blood pressure (!) 150/86, pulse 92, temperature (!) 101.9 ?F (38.8 ?C), temperature source Oral, resp. rate 18, height 5\' 7"  (1.702 m), weight 78.9 kg, SpO2 92 %. ?   ?   ? ?Intake/Output Summary (Last 24 hours) at 09/17/2021 0855 ?Last data filed at 09/16/2021 2200 ?Gross per 24 hour  ?Intake 1704 ml  ?Output 300 ml  ?Net 1404 ml   ? ?Filed Weights  ? 09/14/21 0305  ?Weight: 78.9 kg  ? ? ?Examination: ?General:  young adult male sitting up in bed in NAD ?HEENT: MM pink/moist, anicteric  ?Neuro: AAOx4, speech clear, MAE / normal strength  ?CV: s1s2 RRR, no m/r/g ?PULM: non-labored at rest, mild tachypnea/no accessory muscle use, diminished on right with bronchial breath sounds, clear on left  ?GI: soft, bsx4 active  ?Extremities: warm/dry, no edema  ?Skin: no rashes or lesions ? ?Resolved Hospital Problem list   ? ?Assessment & Plan:  ? ?Multifocal Pneumonia right-sided pneumonia involving most of the right upper lobe, also the right lower lobe.  CT imaging consistent with early abscess formation concerning for possible anaerobic infection.  Certainly possible and patient with a history of heavy alcohol use. ?Loculated right pleural effusion consistent with a complicated pleural space, likely parapneumonic versus empyema.  He has continued fever and is a high risk for empyema ? ?Recommendations: ?-continue unasyn  ?-follow up blood, sputum cultures, AFB ?-await [name]  ?-reviewed CT images with Dr. 09/16/21, will ask IR to place CT guided chest tube for complete drainage of pleural space.  Appreciate IR assistance with patient care.  ?-send pleural fluid for LDH, Protein, glucose,  GS with C&S, cell count & cytology  ?-follow intermittent CXR  ?-pulmonary hygiene - IS, mobilize  ?  ?Swahili translator utilized for interview Delton Coombes Molly Maduro.  Reviewed CT images with patient and family, procedure, risks and benefits in detail.  Questions answered.  Consent obtained with #[id number], PA-C at bedside / participated in interview &  consent process.  ? ? ?Critical care time: NA ?  ? ?Canary Brim, MSN, APRN, NP-C, AGACNP-BC ?[facility] ?09/17/2021, 8:55 AM ? ? ?Please see [email or web address] for pager details.  ? ?From 7A-7P if no response, please call [phone] ?After hours, please call [place] [phone] ? ? ? ?

## 2021-09-18 ENCOUNTER — Inpatient Hospital Stay (HOSPITAL_COMMUNITY): Payer: BC Managed Care – PPO

## 2021-09-18 DIAGNOSIS — I3139 Other pericardial effusion (noninflammatory): Secondary | ICD-10-CM | POA: Diagnosis not present

## 2021-09-18 LAB — BODY FLUID CELL COUNT WITH DIFFERENTIAL: Total Nucleated Cell Count, Fluid: 9 cu mm (ref 0–1000)

## 2021-09-18 LAB — CBC WITH DIFFERENTIAL/PLATELET
Abs Immature Granulocytes: 0.05 10*3/uL (ref 0.00–0.07)
Basophils Absolute: 0 10*3/uL (ref 0.0–0.1)
Basophils Relative: 0 %
Eosinophils Absolute: 0 10*3/uL (ref 0.0–0.5)
Eosinophils Relative: 1 %
HCT: 32.8 % — ABNORMAL LOW (ref 39.0–52.0)
Hemoglobin: 11 g/dL — ABNORMAL LOW (ref 13.0–17.0)
Immature Granulocytes: 1 %
Lymphocytes Relative: 15 %
Lymphs Abs: 0.6 10*3/uL — ABNORMAL LOW (ref 0.7–4.0)
MCH: 27.7 pg (ref 26.0–34.0)
MCHC: 33.5 g/dL (ref 30.0–36.0)
MCV: 82.6 fL (ref 80.0–100.0)
Monocytes Absolute: 0.4 10*3/uL (ref 0.1–1.0)
Monocytes Relative: 9 %
Neutro Abs: 3 10*3/uL (ref 1.7–7.7)
Neutrophils Relative %: 74 %
Platelets: 74 10*3/uL — ABNORMAL LOW (ref 150–400)
RBC: 3.97 MIL/uL — ABNORMAL LOW (ref 4.22–5.81)
RDW: 15.7 % — ABNORMAL HIGH (ref 11.5–15.5)
WBC: 4 10*3/uL (ref 4.0–10.5)
nRBC: 0 % (ref 0.0–0.2)

## 2021-09-18 LAB — BASIC METABOLIC PANEL
Anion gap: 11 (ref 5–15)
BUN: 6 mg/dL (ref 6–20)
CO2: 20 mmol/L — ABNORMAL LOW (ref 22–32)
Calcium: 8 mg/dL — ABNORMAL LOW (ref 8.9–10.3)
Chloride: 103 mmol/L (ref 98–111)
Creatinine, Ser: 0.94 mg/dL (ref 0.61–1.24)
GFR, Estimated: >60 mL/min (ref >60)
Glucose, Bld: 102 mg/dL — ABNORMAL HIGH (ref 70–99)
Potassium: 3.8 mmol/L (ref 3.5–5.1)
Sodium: 134 mmol/L — ABNORMAL LOW (ref 135–145)

## 2021-09-18 LAB — ECHOCARDIOGRAM COMPLETE
Area-P 1/2: 4.6 cm2
Height: 67 in
S' Lateral: 3.7 cm
Weight: 2783.09 oz

## 2021-09-18 LAB — GLUCOSE, PLEURAL OR PERITONEAL FLUID: Glucose, Fluid: 68 mg/dL

## 2021-09-18 LAB — PROTEIN, PLEURAL OR PERITONEAL FLUID: Total protein, fluid: 4.1 g/dL

## 2021-09-18 NOTE — Progress Notes (Signed)
?  Echocardiogram ?2D Echocardiogram has been performed. ? ?Calvin Moore ?09/18/2021, 4:34 PM ?

## 2021-09-18 NOTE — Progress Notes (Signed)
Mobility Specialist: Progress Note ? ? 09/18/21 1724  ?Mobility  ?Activity Ambulated independently in hallway  ?Level of Assistance Independent  ?Assistive Device None  ?Distance Ambulated (ft) 1650 ft  ?Activity Response Tolerated well  ?$Mobility charge 1 Mobility  ? ?Received pt in bed having no complaints and agreeable to mobility. Asymptomatic throughout ambulation, returned back to bed w/ call bell in reach and all needs met. ? ?Harrell Gave Feven Alderfer ?Mobility Specialist ?Mobility Specialist [place]: [phone] ?Mobility Specialist [place]: [phone] ? ?

## 2021-09-18 NOTE — Progress Notes (Signed)
?PROGRESS NOTE ? ?Calvin Moore  [id number] DOB: 1995-08-04 DOA: 09/13/2021 ?PCP: Patient, No Pcp Per (Inactive)  ? ?Brief Narrative: ?Patient is a 26 year old male with history of chronic alcohol abuse who presented to the emergency department with complaints of right-sided chest pain, right upper quadrant pain.  Pain was worse with deep inspiration/coughing, he was also complaining of shortness of breath.  Chest imaging showed multifocal pneumonia.  Started on IV antibiotics for CAP.  Hospital course remarkable for persistent fever, though he does not require oxygen.  CT chest done on 3/24 showed worsening pneumonia,possible  posterior right upper lobe pulmonary necrosis,moderate locular right pleural effusion, increased.  PCCM consulted.  Underwent  chest tube placement on 3/25 ? ?Assessment & Plan: ? ?Principal Problem: ?  Multifocal pneumonia ?Active Problems: ?  Pericardial effusion ?  Splenomegaly ?  Thrombocytopenia ([place]) ?  Alcohol use ?  Hospital acquired PNA ?  Hypokalemia ?  Pleural effusion ? ? ?Assessment and Plan: ?* Multifocal pneumonia ?Presented with shortness of breath, cough, right-sided chest pain, right upper quadrant pain.  CT chest on admission showed right upper lobe consolidation and patchy right lower lobe airspace disease suggesting multifocal pneumonia. Also showed ill-defined nodular densities in the left upper lobe likely infectious/inflammatory. ?Patient has history of heavy alcohol use, aspiration pneumonia is a possibility.His chest pain is pleuritic in nature and is associated with pneumonia. ?Follow-up chest x-ray showed stable right upper consolidation, small pleural effusion.Blood cultures ordered,NGTD. ?He remained persistently febrile.Started on IV antibiotics for CAP.  Hospital course remarkable for persistent fever, though he does not require oxygen.  CT chest done on 3/24 showed worsening pneumonia,possible  posterior right upper lobe pulmonary necrosis,moderate  locular right pleural effusion, increased.  PCCM consulted. S/P chest tube placement. ?Continue Dilaudid for severe pleuritic chest pain.  No leukocytosis, blood pressure stable.  Follow-up pleural fluid culture ? ?Alcohol use ?Drinks heavily as per the brother.  Started on CIWA protocol.  Continue thiamine and folic acid.  Right upper quadrant ultrasound shows possible cirrhosis. Counseled for cessation . ? ?Thrombocytopenia ([place]) ?Likely associated with splenomegaly/cirrhosis .  Continue to monitor ? ?Splenomegaly ?Likely alcohol related.  Has thrombocytopenia ? ?Pericardial effusion ?Chest CT showed small to moderate pericardial effusion.  Patient is asymptomatic.  Will check echocardiogram ? ?Hypokalemia ?Supplemented with potassium ? ? ? ? ? ? ?  ?  ? ?DVT prophylaxis:Place and maintain sequential compression device Start: 09/14/21 0341 ? ? ?  Code Status: Full Code ? ?Family Communication: Family  at bedside on 3/25 ? ?Patient status:Inpatient  ? ?Patient is from :Home ? ?Anticipated discharge [id number] ? ?Estimated DC date:after chest tube removal ? ? ?Consultants: None ? ?Procedures:None ? ?Antimicrobials:  ?Anti-infectives (From admission, onward)  ? ? Start     Dose/Rate Route Frequency Ordered Stop  ? 09/16/21 2000  Ampicillin-Sulbactam (UNASYN) 3 g in sodium chloride 0.9 % 100 mL IVPB       ? 3 g ?200 mL/hr over 30 Minutes Intravenous Every 6 hours 09/16/21 1751 09/21/21 1959  ? 09/14/21 2200  cefTRIAXone (ROCEPHIN) 2 g in sodium chloride 0.9 % 100 mL IVPB  Status:  Discontinued       ? 2 g ?200 mL/hr over 30 Minutes Intravenous Every 24 hours 09/13/21 2233 09/16/21 1747  ? 09/14/21 2200  azithromycin (ZITHROMAX) 500 mg in sodium chloride 0.9 % 250 mL IVPB  Status:  Discontinued       ? 500 mg ?250 mL/hr over 60 Minutes Intravenous Every 24 hours  09/13/21 2233 09/16/21 1747  ? 09/13/21 2015  cefTRIAXone (ROCEPHIN) 1 g in sodium chloride 0.9 % 100 mL IVPB       ? 1 g ?200 mL/hr over 30 Minutes Intravenous   Once 09/13/21 2012 09/13/21 2143  ? 09/13/21 2015  azithromycin (ZITHROMAX) 500 mg in sodium chloride 0.9 % 250 mL IVPB       ? 500 mg ?250 mL/hr over 60 Minutes Intravenous  Once 09/13/21 2012 09/13/21 2244  ? ?  ? ? ?Subjective: ? ?Patient seen and examined at the bedside this morning.  Looks comfortable today.  Pain better after chest tube placement.  On room air.  Again had fever of 101.5 ?F this morning ? ?Objective: ?Vitals:  ? 09/17/21 1923 09/18/21 0443 09/18/21 0802 09/18/21 0803  ?BP: (!) 143/87 131/79 (!) 162/101 (!) 149/83  ?Pulse: 78 81 81 76  ?Resp: 18  18   ?Temp: 100.2 ?F (37.9 ?C) (!) 101.5 ?F (38.6 ?C) 98.7 ?F (37.1 ?C)   ?TempSrc: Oral Oral    ?SpO2: 97% 95% 95%   ?Weight:      ?Height:      ? ? ?Intake/Output Summary (Last 24 hours) at 09/18/2021 1045 ?Last data filed at 09/18/2021 0554 ?Gross per 24 hour  ?Intake 1431.65 ml  ?Output 2450 ml  ?Net -1018.35 ml  ? ?Filed Weights  ? 09/14/21 0305  ?Weight: 78.9 kg  ? ? ?Examination: ? ?General exam: Overall comfortable, not in distress ?HEENT: PERRL ?Respiratory system:  no wheezes or crackles , right-sided chest tube, diminished air sounds on the right side ?Cardiovascular system: S1 & S2 heard, RRR.  ?Gastrointestinal system: Abdomen is nondistended, soft and nontender. ?Central nervous system: Alert and oriented ?Extremities: No edema, no clubbing ,no cyanosis ?Skin: No rashes, no ulcers,no icterus   ? ? ?Data Reviewed: I have personally reviewed following labs and imaging studies ? ?CBC: ?Recent Labs  ?Lab 09/13/21 ?1647 09/14/21 ?0036 09/14/21 ?0503 09/16/21 ?0404 09/17/21 ?1005 09/18/21 ?[id number]  ?WBC 9.1 7.6  --  2.5* 3.7* 4.0  ?NEUTROABS 7.4  --   --  1.7 2.4 3.0  ?HGB 13.7 12.8*  --  11.4* 11.8* 11.0*  ?HCT 42.8 38.8*  --  33.9* 35.5* 32.8*  ?MCV 85.4 85.3  --  83.1 82.8 82.6  ?PLT PLATELET CLUMPS NOTED ON SMEAR, UNABLE TO ESTIMATE 63* 64* 56* 67* 74*  ? ?Basic Metabolic Panel: ?Recent Labs  ?Lab 09/13/21 ?1647 09/14/21 ?0036 09/16/21 ?0404  09/17/21 ?1005 09/18/21 ?[id number]  ?NA 136 134* 135 135 134*  ?K 3.7 3.5 3.1* 3.7 3.8  ?CL 98 99 102 105 103  ?CO2 27 26 25  21* 20*  ?GLUCOSE 121* 104* 186* 101* 102*  ?BUN 13 11 6  5* 6  ?CREATININE 1.16 1.13 1.07 0.92 0.94  ?CALCIUM 9.3 8.8* 8.1* 8.4* 8.0*  ? ? ? ?Recent Results (from the past 240 hour(s))  ?Resp Panel by RT-PCR (Flu A&B, Covid) Nasopharyngeal Swab     Status: None  ? Collection Time: 09/14/21 12:15 AM  ? Specimen: Nasopharyngeal Swab; Nasopharyngeal(NP) swabs in vial transport medium  ?Result Value Ref Range Status  ? SARS Coronavirus 2 by RT PCR NEGATIVE NEGATIVE Final  ?  Comment: (NOTE) ?SARS-CoV-2 target nucleic acids are NOT DETECTED. ? ?The SARS-CoV-2 RNA is generally detectable in upper respiratory ?specimens during the acute phase of infection. The lowest ?concentration of SARS-CoV-2 viral copies this assay can detect is ?138 copies/mL. A negative result does not preclude SARS-Cov-2 ?infection and should not  be used as the sole basis for treatment or ?other patient management decisions. A negative result may occur with  ?improper specimen collection/handling, submission of specimen other ?than nasopharyngeal swab, presence of viral mutation(s) within the ?areas targeted by this assay, and inadequate number of viral ?copies(<138 copies/mL). A negative result must be combined with ?clinical observations, patient history, and epidemiological ?information. The expected result is Negative. ? ?Fact Sheet for Patients:  ?EntrepreneurPulse.com.au ? ?Fact Sheet for Healthcare Providers:  ?IncredibleEmployment.be ? ?This test is no t yet approved or cleared by the [place] FDA and  ?has been authorized for detection and/or diagnosis of SARS-CoV-2 by ?FDA under an Emergency Use Authorization (EUA). This EUA will remain  ?in effect (meaning this test can be used) for the duration of the ?COVID-19 declaration under Section 564(b)(1) of the Act, 21 ?U.S.C.section  360bbb-3(b)(1), unless the authorization is terminated  ?or revoked sooner.  ? ? ?  ? Influenza A by PCR NEGATIVE NEGATIVE Final  ? Influenza B by PCR NEGATIVE NEGATIVE Final  ?  Comment: (NOTE) ?The Xpert Xpress S

## 2021-09-18 NOTE — Progress Notes (Signed)
? ?NAME:  Calvin Moore, MRN:  [id number], DOB:  18-Feb-1996, LOS: 4 ?ADMISSION DATE:  09/13/2021, CONSULTATION DATE:  09/16/21 ?REFERRING MD:  Dr. Renford Dills, CHIEF COMPLAINT:  09/16/21  ? ?History of Present Illness:  ?26 year old male with prior hx of heavy ETOH abuse who presented on 3/21 with two day history of progressively worse right sided chest pain and RUQ abdominal pain with associated shortness of breath.  CTA PE neg but showed multifocal pneumonia with small partially loculated right pleural effusion.  Additionally found to be thrombocytopenic with splenomegaly and suspected cirrhosis. Admitted to [facility] and started on azithromycin and ceftriaxone.  Patient continued to be febrile, therefore CT chest was rechecked which showed worsening multifocal pneumonia, right worse than left, and suggestive of pulmonary necrosis in right upper lobe with increased loculated pleural effusion, moderate size now.  He remains on room air.  Pulmonary consulted for further recommendations.  ? ?Pertinent  Medical History  ?ETOH abuse  ? ?Significant Hospital Events: ?Including procedures, antibiotic start and stop dates in addition to other pertinent events   ?3/21 admitted [facility] w/ multifocal pna, small partially loculated R effusion, thrombocytopenia, splenomegaly on azithro/ ceftriaxone. COVID, flu, RVP negative ?3/24 still febrile, repeat CT chest > worsening multifocal pna R>L with concern for RUL abscess, mod loculated R effusion.  Transitioned to Unasyn.  PCCM consulted.  ?3/26 IR CT guided chest tube placed  ? ?Interim History / Subjective:  ?Tmax 101.5  ?Chest tube placed 3/25  ?Pt reports pain medication helping with site pain  ? ?Objective   ?Blood pressure 131/79, pulse 81, temperature (!) 101.5 ?F (38.6 ?C), temperature source Oral, resp. rate 18, height 5\' 7"  (1.702 m), weight 78.9 kg, SpO2 95 %. ?   ?   ? ?Intake/Output Summary (Last 24 hours) at 09/18/2021 0725 ?Last data filed at 09/18/2021 0554 ?Gross per 24 hour   ?Intake 1431.65 ml  ?Output 2450 ml  ?Net -1018.35 ml  ? ?Filed Weights  ? 09/14/21 0305  ?Weight: 78.9 kg  ? ? ?Examination: ?General:  adult male sitting up in bed in NAD, eating breakfast  ?HEENT: MM pink/moist, anicteric  ?Neuro: AAOx4, speech clear, MAE ?CV: s1s2 RRR, no m/r/g ?PULM: non-labored at rest, clear on left, improved air entry on right, chest tube intact on suction -20cm, bloody/yellow drainage with debris in chamber ?GI: soft, bsx4 active  ?Extremities: warm/dry, no edema  ?Skin: no rashes or lesions ? ?Resolved Hospital Problem list   ? ? ?Assessment & Plan:  ? ?Multifocal Pneumonia right-sided pneumonia involving most of the right upper lobe, also the right lower lobe.  CT imaging consistent with early abscess formation concerning for possible anaerobic infection.  Certainly possible and patient with a history of heavy alcohol use. ?Loculated right pleural effusion consistent with a complicated pleural space, likely parapneumonic versus empyema.  He has continued fever and is a high risk for empyema. ? ?Recommendations: ?-continue unasyn  ?-await quantiferon gold  ?-appreciate IR assistance with chest tube placement ?-continue pleural drainage, repeat CT chest tomorrow. Pending findings, could consider pleural lytics but suspect residual CXR findings are infiltrate based on prior CT ?-have called lab regarding pleural studies  ?-follow intermittent CXR ?-chest tube care per protocol  ?-pulmonary hygiene -IS, mobilize  ? ?Critical care time: n/a ?  ? ?09/16/21, MSN, APRN, NP-C, AGACNP-BC ?[facility] ?09/18/2021, 7:25 AM ? ? ?Please see [email or web address] for pager details.  ? ?From 7A-7P if no response, please call [phone] ?After  hours, please call [place] [phone] ? ? ? ?

## 2021-09-18 NOTE — Progress Notes (Signed)
? ? ?Referring Physician(s): Canary Brim, NP ? ?Supervising Physician: Mir, Mauri Reading ? ?Patient Status:  [facility] - In-pt ? ?Chief Complaint: Follow up right chest tube placement 3/26 in IR ? ?Subjective: ? ?Patient reports pain at chest tube insertion site ? ?Allergies: ?Patient has no known allergies. ? ?Medications: ?Prior to Admission medications   ?Not on File  ? ? ? ?Vital Signs: ?BP (!) 149/83 (BP Location: Right Arm)   Pulse 76   Temp 98.7 ?F (37.1 ?C)   Resp 18   Ht 5\' 7"  (1.702 m)   Wt 173 lb 15.1 oz (78.9 kg)   SpO2 95%   BMI 27.24 kg/m?  ? ?Physical Exam ?Vitals and nursing note reviewed.  ?Constitutional:   ?   General: He is not in acute distress. ?Cardiovascular:  ?   Rate and Rhythm: Normal rate.  ?Pulmonary:  ?   Effort: Pulmonary effort is normal.  ?   Comments: (+) right lateral chest tube in place, insertion site TTP. Serosanguineous output with some debris in pleurvac ?Skin: ?   General: Skin is warm and dry.  ?Neurological:  ?   Mental Status: He is alert.  ? ? ?Imaging: ?CT CHEST W CONTRAST ? ?Result Date: 09/16/2021 ?CLINICAL DATA:  Pneumonia.  Lung abscess suspected. EXAM: CT CHEST WITH CONTRAST TECHNIQUE: Multidetector CT imaging of the chest was performed during intravenous contrast administration. RADIATION DOSE REDUCTION: This exam was performed according to the departmental dose-optimization program which includes automated exposure control, adjustment of the mA and/or kV according to patient size and/or use of iterative reconstruction technique. CONTRAST:  26mL OMNIPAQUE IOHEXOL 300 MG/ML  SOLN COMPARISON:  Chest radiograph of 1 day prior. CTA chest of 09/13/2021. FINDINGS: Cardiovascular: Bovine arch. Normal aortic caliber. Mild cardiomegaly. Small to moderate anterior pericardial effusion, adjacent to loculated right-sided pleural fluid. No central pulmonary embolism, on this non-dedicated study. Mediastinum/Nodes: Limited evaluation for mediastinal adenopathy secondary to  mediastinal edema and mild motion. Lungs/Pleura: Some similar trace left pleural fluid. Small to moderate right-sided pleural effusion is slightly increased. Areas of loculation, including posterior medially on 66/3 persist. There is new loculation anteriorly and inferiorly including on 91/3. Minimal focal gas on 56/4 is favored to be extra alveolar and indicative of necrosis. Right lower lobe airspace disease posteriorly is also slightly progressive. Left upper lobe reticulonodular opacities are slightly increased an indicative of infection. Mild motion degradation, especially inferiorly. Upper Abdomen: Moderate cirrhosis. Motion degradation. Incompletely imaged splenomegaly. Grossly normal imaged stomach, right adrenal gland, left kidney. Musculoskeletal: No acute osseous abnormality. IMPRESSION: 1. Mildly motion degraded exam. 2. Slightly worsening right greater than left pneumonia. Minimal extraalveolar air in the posterior right upper lobe suggests pulmonary necrosis. 3. Moderate locular right pleural effusion, increased. 4. Suspect concurrent small to moderate pericardial effusion adjacent to loculated medial right pleural fluid. 5. Cirrhosis, incompletely imaged. 6. Similar trace left pleural fluid. Electronically Signed   By: 09/15/2021 M.D.   On: 09/16/2021 16:09  ? ?DG CHEST PORT 1 VIEW ? ?Result Date: 09/15/2021 ?CLINICAL DATA:  Pneumonia EXAM: PORTABLE CHEST 1 VIEW COMPARISON:  Chest x-ray dated September 13, 2021 FINDINGS: Cardiac and mediastinal contours are unchanged. Right upper lobe consolidation. Small right pleural effusion. No pneumothorax. IMPRESSION: Unchanged right upper lobe consolidation and small right pleural effusion. Electronically Signed   By: September 15, 2021 M.D.   On: 09/15/2021 08:08  ? ?CT IMAGE GUIDED DRAINAGE BY PERCUTANEOUS CATHETER ? ?Result Date: 09/17/2021 ?INDICATION: 26 year old gentleman with with pneumonia and loculated right  parapneumonic effusion presents to IR for  CT-guided right chest tube placement. EXAM: CT-guided right chest tube MEDICATIONS: The patient is currently admitted to the hospital and receiving intravenous antibiotics. The antibiotics were administered within an appropriate time frame prior to the initiation of the procedure. ANESTHESIA/SEDATION: Moderate (conscious) sedation was employed during this procedure. A total of Versed 2 mg and Fentanyl 100 mcg was administered intravenously by the radiology nurse. Total intra-service moderate Sedation Time: 15 minutes. The patient's level of consciousness and vital signs were monitored continuously by radiology nursing throughout the procedure under my direct supervision. COMPLICATIONS: None immediate. PROCEDURE: Informed written consent was obtained from the patient after a thorough discussion of the procedural risks, benefits and alternatives. All questions were addressed. Maximal Sterile Barrier Technique was utilized including caps, mask, sterile gowns, sterile gloves, sterile drape, hand hygiene and skin antiseptic. A timeout was performed prior to the initiation of the procedure. Patient positioned supine on the procedure table. The right lateral chest wall skin prepped and draped usual fashion. Following local lidocaine administration, the right pleural space was entered with a 19 gauge Yueh using CT guidance. Yueh catheter was removed over 0.035 inch guidewire. Serial dilation was performed and 14 [place] multipurpose pigtail drain was inserted. Final position of the drain was confirmed with CT. The drain was secured to skin with suture then connected to Pleur-Evac set at -20 cm H2O. IMPRESSION: 14.0 [place] multipurpose pigtail drain inserted into the right pleural space utilized CT guidance. Electronically Signed   By: Acquanetta Belling M.D.   On: 09/17/2021 18:08   ? ?Labs: ? ?CBC: ?Recent Labs  ?  09/14/21 ?0036 09/14/21 ?0503 09/16/21 ?0404 09/17/21 ?1005 09/18/21 ?7026  ?WBC 7.6  --  2.5* 3.7* 4.0  ?HGB 12.8*   --  11.4* 11.8* 11.0*  ?HCT 38.8*  --  33.9* 35.5* 32.8*  ?PLT 63* 64* 56* 67* 74*  ? ? ?COAGS: ?Recent Labs  ?  09/14/21 ?0503 09/17/21 ?1005  ?INR 1.5* 1.3*  ?APTT 35  --   ? ? ?BMP: ?Recent Labs  ?  09/14/21 ?0036 09/16/21 ?0404 09/17/21 ?1005 09/18/21 ?3785  ?NA 134* 135 135 134*  ?K 3.5 3.1* 3.7 3.8  ?CL 99 102 105 103  ?CO2 26 25 21* 20*  ?GLUCOSE 104* 186* 101* 102*  ?BUN 11 6 5* 6  ?CALCIUM 8.8* 8.1* 8.4* 8.0*  ?CREATININE 1.13 1.07 0.92 0.94  ?GFRNONAA >60 >60 >60 >60  ? ? ?LIVER FUNCTION TESTS: ?Recent Labs  ?  09/13/21 ?1647 09/17/21 ?1005  ?BILITOT 1.4*  --   ?AST 26  --   ?ALT 34  --   ?ALKPHOS 39  --   ?PROT 7.4 5.9*  ?ALBUMIN 3.5  --   ? ? ?Assessment and Plan: ? ?26 y/o M admitted 3/21 with multifocal pneumonia and partially loculated right pleural effusion s/p right chest tube placement 3/25 in IR seen today for drain follow up. ? ?Patient c/o pain at insertion site but otherwise no complaints. Right lateral chest tube insertion site unremarkable, appropriately TTP, no bleeding noted. Per I/O 1,050 cc out since placement, fluid appears serosanguineous with debris, no air leak noted on exam today. Cultures of aspirate NGTD. ? ?Per CCM note plan for CT chest tomorrow and possible consideration of lytic therapy.  ? ?Plan for chest tube per CCM discretion, IR remains available as needed - please call with any questions or concerns. ? ?Electronically Signed: ?Villa Herb, PA-C ?09/18/2021, 11:34 AM ? ? ?I spent  a total of 15 Minutes at the the patient's bedside AND on the patient's hospital floor or unit, greater than 50% of which was counseling/coordinating care for right chest tube follow up. ? ?

## 2021-09-19 ENCOUNTER — Inpatient Hospital Stay (HOSPITAL_COMMUNITY): Payer: BC Managed Care – PPO

## 2021-09-19 DIAGNOSIS — J189 Pneumonia, unspecified organism: Secondary | ICD-10-CM | POA: Diagnosis not present

## 2021-09-19 MED ORDER — SODIUM CHLORIDE (PF) 0.9 % IJ SOLN
10.0000 mg | Freq: Once | INTRAMUSCULAR | Status: AC
  Administered 2021-09-19: 10 mg via INTRAPLEURAL
  Filled 2021-09-19: qty 10

## 2021-09-19 MED ORDER — SODIUM CHLORIDE 0.9% FLUSH
10.0000 mL | Freq: Three times a day (TID) | INTRAVENOUS | Status: DC
  Administered 2021-09-19 – 2021-09-23 (×9): 10 mL

## 2021-09-19 MED ORDER — GUAIFENESIN-DM 100-10 MG/5ML PO SYRP
5.0000 mL | ORAL_SOLUTION | ORAL | Status: DC | PRN
Start: 2021-09-19 — End: 2021-09-24
  Administered 2021-09-20 – 2021-09-22 (×2): 5 mL via ORAL
  Filled 2021-09-19 (×3): qty 10

## 2021-09-19 MED ORDER — STERILE WATER FOR INJECTION IJ SOLN
5.0000 mg | Freq: Once | RESPIRATORY_TRACT | Status: AC
  Administered 2021-09-19: 5 mg via INTRAPLEURAL
  Filled 2021-09-19: qty 5

## 2021-09-19 NOTE — Progress Notes (Signed)
Pt c/o chest pain. Pain with inspiration and exhalation. Temp 102F. PRN Tylenol admin. Shelly Coss, MD notified. No new orders at this time. Will continue to monitor.  ?

## 2021-09-19 NOTE — Plan of Care (Signed)
?  Problem: Education: Goal: Knowledge of General Education information will improve Description: Including pain rating scale, medication(s)/side effects and non-pharmacologic comfort measures Outcome: Progressing   Problem: Health Behavior/Discharge Planning: Goal: Ability to manage health-related needs will improve Outcome: Progressing   Problem: Clinical Measurements: Goal: Diagnostic test results will improve Outcome: Progressing Goal: Respiratory complications will improve Outcome: Progressing   Problem: Pain Managment: Goal: General experience of comfort will improve Outcome: Progressing   Problem: Safety: Goal: Ability to remain free from injury will improve Outcome: Progressing   Problem: Skin Integrity: Goal: Risk for impaired skin integrity will decrease Outcome: Progressing   

## 2021-09-19 NOTE — Progress Notes (Signed)
?  Transition of Care (TOC) Screening Note ? ? ?Patient Details  ?Name: Calvin Moore ?Date of Birth: Nov 07, 1995 ? ? ?Transition of Care (TOC) CM/SW Contact:    ?Bess Kinds, RN ?Phone Number: [phone] ?09/19/2021, 3:14 PM ? ? ? ?Transition of Care Department [facility]) has reviewed patient and no TOC needs have been identified at this time. We will continue to monitor patient advancement through interdisciplinary progression rounds. If new patient transition needs arise, please place a TOC consult. ?  ?

## 2021-09-19 NOTE — Progress Notes (Signed)
? ?NAME:  Calvin Moore, MRN:  [id number], DOB:  04-17-1996, LOS: 5 ?ADMISSION DATE:  09/13/2021, CONSULTATION DATE:  09/16/21 ?REFERRING MD:  Dr. Renford Dills, CHIEF COMPLAINT:  09/16/21  ? ?History of Present Illness:  ?26 year old male with prior hx of heavy ETOH abuse who presented on 3/21 with two day history of progressively worse right sided chest pain and RUQ abdominal pain with associated shortness of breath.  CTA PE neg but showed multifocal pneumonia with small partially loculated right pleural effusion.  Additionally found to be thrombocytopenic with splenomegaly and suspected cirrhosis. Admitted to [facility] and started on azithromycin and ceftriaxone.  Patient continued to be febrile, therefore CT chest was rechecked which showed worsening multifocal pneumonia, right worse than left, and suggestive of pulmonary necrosis in right upper lobe with increased loculated pleural effusion, moderate size now.  He remains on room air.  Pulmonary consulted for further recommendations.  ? ?Pertinent  Medical History  ?ETOH abuse  ? ?Significant Hospital Events: ?Including procedures, antibiotic start and stop dates in addition to other pertinent events   ?3/21 admitted [facility] w/ multifocal pna, small partially loculated R effusion, thrombocytopenia, splenomegaly on azithro/ ceftriaxone. COVID, flu, RVP negative ?3/24 still febrile, repeat CT chest > worsening multifocal pna R>L with concern for RUL abscess, mod loculated R effusion.  Transitioned to Unasyn.  PCCM consulted.  ?3/25 IR CT guided chest tube placed  ? ?Interim History / Subjective:  ? ?60 cc output from chest tube. No new complaints ? ?Objective   ?Blood pressure (!) 141/90, pulse 89, temperature 99.9 ?F (37.7 ?C), temperature source Oral, resp. rate 18, height 5\' 7"  (1.702 m), weight 78.9 kg, SpO2 95 %. ?   ?   ? ?Intake/Output Summary (Last 24 hours) at 09/19/2021 1127 ?Last data filed at 09/19/2021 0300 ?Gross per 24 hour  ?Intake 3428.65 ml  ?Output 61 ml  ?Net  3367.65 ml  ? ?Filed Weights  ? 09/14/21 0305  ?Weight: 78.9 kg  ? ? ?Examination: ?Gen:      No acute distress ?HEENT:  EOMI, sclera anicteric ?Neck:     No masses; no thyromegaly ?Lungs:    Clear to auscultation bilaterally; normal respiratory effort ?CV:         Regular rate and rhythm; no murmurs ?Abd:      + bowel sounds; soft, non-tender; no palpable masses, no distension ?Ext:    No edema; adequate peripheral perfusion ?Skin:      Warm and dry; no rash ?Neuro: alert and oriented x 3 ?Psych: normal mood and affect  ? ?Resolved Hospital Problem list   ? ? ?Assessment & Plan:  ? ?Multifocal Pneumonia right-sided pneumonia involving most of the right upper lobe, also the right lower lobe.  CT imaging consistent with early abscess formation concerning for possible anaerobic infection.  Certainly possible and patient with a history of heavy alcohol use. ?Loculated right pleural effusion consistent with a complicated pleural space, likely parapneumonic versus empyema.  He has continued fever and is a high risk for empyema. ? ?Recommendations: ?-Continue Unasyn ?- Await quantiferon gold  ?- CT reviewed with moderate loculated effusion. Will try DNAse lytics today ?- Have called lab regarding pleural studies  ?- Follow intermittent CXR ?- Chest tube care per protocol  ?- Pulmonary hygiene -IS, mobilize  ? ?Critical care time: n/a  ? ?09/16/21 MD ?[facility] ?See Amion for pager ? ?If no response to pager , please call [phone] until 7pm ?  After 7:00 pm call Elink  [phone] ?09/19/2021, 11:27 AM  ? ? ? ?

## 2021-09-19 NOTE — Procedures (Signed)
Pleural Fibrinolytic Administration Procedure Note ? ?Calvin Moore  ?[id number]  ?1995/08/04 ? ?Date:09/19/21  ?Time:1:53 PM  ? ?Provider Performing:Dashia Caldeira  ? ?Procedure: Pleural Fibrinolysis Initial day (62563) ? ?Indication(s) ?Fibrinolysis of complicated pleural effusion ? ?Consent ?Risks of the procedure as well as the alternatives and risks of each were explained to the patient and/or caregiver.  Consent for the procedure was obtained. ? ? ?Anesthesia ?None ? ? ?Time Out ?Verified patient identification, verified procedure, site/side was marked, verified correct patient position, special equipment/implants available, medications/allergies/relevant history reviewed, required imaging and test results available. ? ? ?Sterile Technique ?Hand hygiene, gloves ? ? ?Procedure Description ?Existing pleural catheter was cleaned and accessed in sterile manner.  10mg  of tPA in 30cc of saline and 5mg  of dornase in 30cc of sterile water were injected into pleural space using existing pleural catheter.  Catheter will be clamped for 1 hour and then placed back to suction. ? ? ?Complications/Tolerance ?None; patient tolerated the procedure well. ? ? ?EBL ?None ? ? ?Specimen(s) ?None ? ? MD ?[facility] ?See Amion for pager ? ?If no response to pager , please call [phone] until 7pm ?After 7:00 pm call Elink  [phone] ?09/19/2021, 1:54 PM  ? ?

## 2021-09-19 NOTE — Progress Notes (Signed)
?PROGRESS NOTE ? ?Calvin Moore  MVH:[id number] DOB: [birth year] DOA: 09/13/2021 ?PCP: Patient, No Pcp Per (Inactive)  ? ?Brief Narrative: ?Patient is a 26 year old male with history of chronic alcohol abuse who presented to the emergency department with complaints of right-sided chest pain, right upper quadrant pain.  Pain was worse with deep inspiration/coughing, he was also complaining of shortness of breath.  Chest imaging showed multifocal pneumonia.  Started on IV antibiotics for CAP.  Hospital course remarkable for persistent fever, though he does not require oxygen.  CT chest done on 3/24 showed worsening pneumonia,possible  posterior right upper lobe pulmonary necrosis,moderate locular right pleural effusion, increased.  PCCM consulted and following.  Underwent  chest tube placement on 3/25.  Discharge plan after chest tube removal ? ?Assessment & Plan: ? ?Principal Problem: ?  Multifocal pneumonia ?Active Problems: ?  Pericardial effusion ?  Splenomegaly ?  Thrombocytopenia (HCC) ?  Alcohol use ?  Hospital acquired PNA ?  Hypokalemia ?  Pleural effusion ? ? ?Assessment and Plan: ?* Multifocal pneumonia ?Presented with shortness of breath, cough, right-sided chest pain, right upper quadrant pain.  CT chest on admission showed right upper lobe consolidation and patchy right lower lobe airspace disease suggesting multifocal pneumonia. Also showed ill-defined nodular densities in the left upper lobe likely infectious/inflammatory. ?Patient has history of heavy alcohol use, aspiration pneumonia is a possibility.His chest pain is pleuritic in nature and is associated with pneumonia. ?Follow-up chest x-ray showed stable right upper consolidation, small pleural effusion.Blood cultures ordered,NGTD. ?Started on IV antibiotics for CAP,now on unasyn.  Hospital course remarkable for persistent fever, though he does not require oxygen.  CT chest done on 3/24 showed worsening pneumonia,possible  posterior right upper  lobe pulmonary necrosis,moderate locular right pleural effusion, increased.  PCCM consulted. S/P chest tube placement. ?Continue Dilaudid for severe pleuritic chest pain.  No leukocytosis, blood pressure stable.  Follow-up pleural fluid culture:hasnot shown any organism ? ?Alcohol use ?Drinks heavily as per the brother.  Started on CIWA protocol.  Continue thiamine and folic acid.  Right upper quadrant ultrasound shows possible cirrhosis. Counseled for cessation . ? ?Thrombocytopenia (HCC) ?Likely associated with splenomegaly/cirrhosis .  Continue to monitor ? ?Splenomegaly ?Likely alcohol related.  Has thrombocytopenia ? ?Pericardial effusion ?Chest CT showed small to moderate pericardial effusion.  Patient is asymptomatic.  Echocardiogram showed very small pericardial effusion, no evidence of tamponade, normal left ventricle ejection fraction ? ?Hypokalemia ?Supplemented with potassium ? ? ? ? ? ? ?  ?  ? ?DVT prophylaxis:Place and maintain sequential compression device Start: 09/14/21 0341 ? ? ?  Code Status: Full Code ? ?Family Communication: Family  at bedside on 3/27 ? ?Patient status:Inpatient  ? ?Patient is from :Home ? ?Anticipated discharge WU:XLKG ? ?Estimated DC date:after chest tube removal ? ? ?Consultants: None ? ?Procedures:None ? ?Antimicrobials:  ?Anti-infectives (From admission, onward)  ? ? Start     Dose/Rate Route Frequency Ordered Stop  ? 09/16/21 2000  Ampicillin-Sulbactam (UNASYN) 3 g in sodium chloride 0.9 % 100 mL IVPB       ? 3 g ?200 mL/hr over 30 Minutes Intravenous Every 6 hours 09/16/21 1751 09/21/21 1959  ? 09/14/21 2200  cefTRIAXone (ROCEPHIN) 2 g in sodium chloride 0.9 % 100 mL IVPB  Status:  Discontinued       ? 2 g ?200 mL/hr over 30 Minutes Intravenous Every 24 hours 09/13/21 2233 09/16/21 1747  ? 09/14/21 2200  azithromycin (ZITHROMAX) 500 mg in sodium chloride 0.9 % 250 mL  IVPB  Status:  Discontinued       ? 500 mg ?250 mL/hr over 60 Minutes Intravenous Every 24 hours 09/13/21  2233 09/16/21 1747  ? 09/13/21 2015  cefTRIAXone (ROCEPHIN) 1 g in sodium chloride 0.9 % 100 mL IVPB       ? 1 g ?200 mL/hr over 30 Minutes Intravenous  Once 09/13/21 2012 09/13/21 2143  ? 09/13/21 2015  azithromycin (ZITHROMAX) 500 mg in sodium chloride 0.9 % 250 mL IVPB       ? 500 mg ?250 mL/hr over 60 Minutes Intravenous  Once 09/13/21 2012 09/13/21 2244  ? ?  ? ? ?Subjective: ? ?Patient seen and examined at the bedside this morning.  Comfortable.  Pain on the right chest is well controlled.  Denies any worsening cough or shortness of breath.  On chest tube.  He says he feels better.  Afebrile this morning ? ?Objective: ?Vitals:  ? 09/19/21 0000 09/19/21 0317 09/19/21 0700 09/19/21 1100  ?BP: 134/60 (!) 155/95 (!) 145/102 (!) 141/90  ?Pulse: 67 83 77 89  ?Resp: 16 17 16 18   ?Temp: 98.3 ?F (36.8 ?C) 99.9 ?F (37.7 ?C)    ?TempSrc: Oral Oral    ?SpO2: 95% 96% 97% 95%  ?Weight:      ?Height:      ? ? ?Intake/Output Summary (Last 24 hours) at 09/19/2021 1133 ?Last data filed at 09/19/2021 0300 ?Gross per 24 hour  ?Intake 3428.65 ml  ?Output 61 ml  ?Net 3367.65 ml  ? ?Filed Weights  ? 09/14/21 0305  ?Weight: 78.9 kg  ? ? ?Examination: ? ?General exam: Overall comfortable, not in distress ?HEENT: PERRL ?Respiratory system:  no wheezes or crackles .  Right-sided chest tube, decreased air entry on the right side ?Cardiovascular system: S1 & S2 heard, RRR.  ?Gastrointestinal system: Abdomen is nondistended, soft and nontender. ?Central nervous system: Alert and oriented ?Extremities: No edema, no clubbing ,no cyanosis ?Skin: No rashes, no ulcers,no icterus   ? ? ?Data Reviewed: I have personally reviewed following labs and imaging studies ? ?CBC: ?Recent Labs  ?Lab 09/13/21 ?1647 09/14/21 ?0036 09/14/21 ?0503 09/16/21 ?0404 09/17/21 ?1005 09/18/21 ?[id number]  ?WBC 9.1 7.6  --  2.5* 3.7* 4.0  ?NEUTROABS 7.4  --   --  1.7 2.4 3.0  ?HGB 13.7 12.8*  --  11.4* 11.8* 11.0*  ?HCT 42.8 38.8*  --  33.9* 35.5* 32.8*  ?MCV 85.4 85.3  --   83.1 82.8 82.6  ?PLT PLATELET CLUMPS NOTED ON SMEAR, UNABLE TO ESTIMATE 63* 64* 56* 67* 74*  ? ?Basic Metabolic Panel: ?Recent Labs  ?Lab 09/13/21 ?1647 09/14/21 ?0036 09/16/21 ?0404 09/17/21 ?1005 09/18/21 ?[id number]  ?NA 136 134* 135 135 134*  ?K 3.7 3.5 3.1* 3.7 3.8  ?CL 98 99 102 105 103  ?CO2 27 26 25  21* 20*  ?GLUCOSE 121* 104* 186* 101* 102*  ?BUN 13 11 6  5* 6  ?CREATININE 1.16 1.13 1.07 0.92 0.94  ?CALCIUM 9.3 8.8* 8.1* 8.4* 8.0*  ? ? ? ?Recent Results (from the past 240 hour(s))  ?Resp Panel by RT-PCR (Flu A&B, Covid) Nasopharyngeal Swab     Status: None  ? Collection Time: 09/14/21 12:15 AM  ? Specimen: Nasopharyngeal Swab; Nasopharyngeal(NP) swabs in vial transport medium  ?Result Value Ref Range Status  ? SARS Coronavirus 2 by RT PCR NEGATIVE NEGATIVE Final  ?  Comment: (NOTE) ?SARS-CoV-2 target nucleic acids are NOT DETECTED. ? ?The SARS-CoV-2 RNA is generally detectable in upper respiratory ?specimens during  the acute phase of infection. The lowest ?concentration of SARS-CoV-2 viral copies this assay can detect is ?138 copies/mL. A negative result does not preclude SARS-Cov-2 ?infection and should not be used as the sole basis for treatment or ?other patient management decisions. A negative result may occur with  ?improper specimen collection/handling, submission of specimen other ?than nasopharyngeal swab, presence of viral mutation(s) within the ?areas targeted by this assay, and inadequate number of viral ?copies(<138 copies/mL). A negative result must be combined with ?clinical observations, patient history, and epidemiological ?information. The expected result is Negative. ? ?Fact Sheet for Patients:  ?BloggerCourse.com ? ?Fact Sheet for Healthcare Providers:  ?SeriousBroker.it ? ?This test is no t yet approved or cleared by the [place] FDA and  ?has been authorized for detection and/or diagnosis of SARS-CoV-2 by ?FDA under an Emergency Use  Authorization (EUA). This EUA will remain  ?in effect (meaning this test can be used) for the duration of the ?COVID-19 declaration under Section 564(b)(1) of the Act, 21 ?U.S.C.section 360bbb-3(b)(1), unless

## 2021-09-20 ENCOUNTER — Inpatient Hospital Stay (HOSPITAL_COMMUNITY): Payer: BC Managed Care – PPO

## 2021-09-20 DIAGNOSIS — J90 Pleural effusion, not elsewhere classified: Secondary | ICD-10-CM | POA: Diagnosis not present

## 2021-09-20 DIAGNOSIS — J189 Pneumonia, unspecified organism: Secondary | ICD-10-CM | POA: Diagnosis not present

## 2021-09-20 DIAGNOSIS — F1010 Alcohol abuse, uncomplicated: Secondary | ICD-10-CM | POA: Diagnosis not present

## 2021-09-20 LAB — CULTURE, BLOOD (ROUTINE X 2)
Culture: NO GROWTH
Culture: NO GROWTH
Special Requests: ADEQUATE

## 2021-09-20 LAB — CYTOLOGY - NON PAP

## 2021-09-20 LAB — ACID FAST SMEAR (AFB, MYCOBACTERIA): Acid Fast Smear: NEGATIVE

## 2021-09-20 LAB — LEGIONELLA PNEUMOPHILA SEROGP 1 UR AG: L. pneumophila Serogp 1 Ur Ag: NEGATIVE

## 2021-09-20 MED ORDER — SODIUM CHLORIDE 0.9% FLUSH
10.0000 mL | Freq: Three times a day (TID) | INTRAVENOUS | Status: DC
  Administered 2021-09-20 – 2021-09-23 (×6): 10 mL

## 2021-09-20 MED ORDER — SODIUM CHLORIDE (PF) 0.9 % IJ SOLN
10.0000 mg | Freq: Once | INTRAMUSCULAR | Status: DC
  Filled 2021-09-20: qty 10

## 2021-09-20 MED ORDER — SODIUM CHLORIDE (PF) 0.9 % IJ SOLN
10.0000 mg | Freq: Once | INTRAMUSCULAR | Status: AC
  Administered 2021-09-20: 10 mg via INTRAPLEURAL
  Filled 2021-09-20: qty 10

## 2021-09-20 MED ORDER — STERILE WATER FOR INJECTION IJ SOLN
5.0000 mg | Freq: Once | RESPIRATORY_TRACT | Status: DC
  Filled 2021-09-20: qty 5

## 2021-09-20 MED ORDER — STERILE WATER FOR INJECTION IJ SOLN
5.0000 mg | Freq: Once | RESPIRATORY_TRACT | Status: AC
  Administered 2021-09-20: 5 mg via INTRAPLEURAL
  Filled 2021-09-20: qty 5

## 2021-09-20 NOTE — Progress Notes (Addendum)
? ?NAME:  Calvin Moore, MRN:  [id number], DOB:  07/20/1995, LOS: 6 ?ADMISSION DATE:  09/13/2021, CONSULTATION DATE:  09/16/21 ?REFERRING MD:  Dr. Tawanna Solo, CHIEF COMPLAINT:  09/16/21  ? ?History of Present Illness:  ?26 year old male with prior hx of heavy ETOH abuse who presented on 3/21 with two day history of progressively worse right sided chest pain and RUQ abdominal pain with associated shortness of breath.  CTA PE neg but showed multifocal pneumonia with small partially loculated right pleural effusion.  Additionally found to be thrombocytopenic with splenomegaly and suspected cirrhosis. Admitted to [facility] and started on azithromycin and ceftriaxone.  Patient continued to be febrile, therefore CT chest was rechecked which showed worsening multifocal pneumonia, right worse than left, and suggestive of pulmonary necrosis in right upper lobe with increased loculated pleural effusion, moderate size now.  He remains on room air.  Pulmonary consulted for further recommendations.  ? ?Pertinent  Medical History  ?ETOH abuse  ? ?Significant Hospital Events: ?Including procedures, antibiotic start and stop dates in addition to other pertinent events   ?3/21 admitted TRH w/ multifocal pna, small partially loculated R effusion, thrombocytopenia, splenomegaly on azithro/ ceftriaxone. COVID, flu, RVP negative ?3/24 still febrile, repeat CT chest > worsening multifocal pna R>L with concern for RUL abscess, mod loculated R effusion.  Transitioned to Unasyn.  PCCM consulted.  ?3/25 IR CT guided chest tube placed  ?3/27 DNase/tPA first dose ? ?Interim History / Subjective:  ? ?Had around 800 cc output after DNase/tPA.  Chest x-ray shows persistent loculated effusion.  Febrile to 102 yesterday. ? ?Objective   ?Blood pressure (!) 146/78, pulse 75, temperature 100.3 ?F (37.9 ?C), temperature source Oral, resp. rate 17, height 5\' 7"  (1.702 m), weight 78.9 kg, SpO2 95 %. ?   ?   ? ?Intake/Output Summary (Last 24 hours) at 09/20/2021  0934 ?Last data filed at 09/20/2021 0551 ?Gross per 24 hour  ?Intake 1703.56 ml  ?Output 1320 ml  ?Net 383.56 ml  ? ?Filed Weights  ? 09/14/21 0305  ?Weight: 78.9 kg  ? ? ?Examination: ?Blood pressure (!) 146/78, pulse 75, temperature 100.3 ?F (37.9 ?C), temperature source Oral, resp. rate 17, height 5\' 7"  (1.702 m), weight 78.9 kg, SpO2 95 %. ?Gen:      No acute distress ?HEENT:  EOMI, sclera anicteric ?Neck:     No masses; no thyromegaly ?Lungs:    Clear to auscultation bilaterally; normal respiratory effort ?CV:         Regular rate and rhythm; no murmurs ?Abd:      + bowel sounds; soft, non-tender; no palpable masses, no distension ?Ext:    No edema; adequate peripheral perfusion ?Skin:      Warm and dry; no rash ?Neuro: alert and oriented x 3 ?Psych: normal mood and affect  ? ?Resolved Hospital Problem list   ? ? ?Assessment & Plan:  ? ?Multifocal Pneumonia right-sided pneumonia involving most of the right upper lobe, also the right lower lobe.  CT imaging consistent with early abscess formation concerning for possible anaerobic infection.  Certainly possible and patient with a history of heavy alcohol use. ?Loculated right pleural effusion consistent with a complicated pleural space, likely parapneumonic versus empyema.  He has continued fever and is a high risk for empyema. ? ?Recommendations: ?- Continue Unasyn ?- Await quantiferon gold  ?- CT reviewed with moderate loculated effusion. Will try DNAse lytics second dose today ?- Consult CT surgery as he continues to have moderate loculated effusion with  fevers ?- Follow CXR ?- Chest tube care per protocol  ?- Pulmonary hygiene -IS, mobilize  ? ?Critical care time: n/a  ? ?Marshell Garfinkel MD ?[facility] ?See Amion for pager ? ?If no response to pager , please call [phone] until 7pm ?After 7:00 pm call Elink  [phone] ?09/20/2021, 9:34 AM  ? ? ? ?

## 2021-09-20 NOTE — Consult Note (Addendum)
? ?   ?[address] ?      [place] [id number] ?            [phone]       ? ?Sherilyn CooterHenry Hauge ?[facility] #[id number] ?Date of Birth: 10/01/1995 ? ?Referring: Burnadette PopAdhikari, Amrit, MD ?Primary Care: None ?Consulting: Chilton GreathouseMannam, Praveen, MD:  Critical Care ? ?[employer] Interpreting used for this interview.  Waynetta Sandy?Beth, ID# [id number] ? ?Reason for consult: Evaluation for surgical management of loculated right para-pneumonic effusion ? ? ?History of Present Illness:     ?26 year old male admitted 7 days ago with a 2-day history of worsening shortness of breath, right sided chest pain, and right upper quadrant pain. Prior to this episode he was reportedly healthy with no significant past medical history except for heavy ETOH abuse. Initial work up included a CTA with PE protocol that was negative but showed right multi-focal pneumonia with an associated small loculated pleural effusion.  Other findings on admission included thrombocytopenia (63,000 ->74,000), splenomegaly, and suspicion of cirrhosis.  Blood cultures, respiratory panel, COVID testing, Hepatitis screening, and HIV testing have all been negative. After a few days of treatment with broad-spectrum antibiotics he remained febrile.  The chest CT was repeated on 09/16/21 showing worsening of the bilateral multifocal pneumonia, increase in the right pleural effusion, and concern for a RUL abscess. The ABX  regimen was changed to Unasyn to cover for potential aspiration and pulmonary / critical care medicine was consulted along with interventional radiology. A CT-guided right pleural tube was placed on 09/17/21 by IR yielding about 1L of fluid over initial 24 hours. Fluid samples were sent for culture and Grams stain.  No organisms were seen on the grams stain and cultures remain negative to date. AFB culture and smear and still pending and a [name] is also pending.  An echocardiogram has been performed this admission and is  unrevealing. ?Following the drainage procedure, the chest CT was repeated yesterday showing persistent moderate to large loculated right pleural effusion and a 7cmx7cm RUL "masslike partially cavitary lesion".  Pleural fibrinolysis was performed yesterday by PCCM with another 800ml drained. Despite this, he has continued to have fevers. ?CT surgery has been asked to evaluate for potential surgical exploration and drainage of the complex right pleural effusion and possible decortication.   ?Using the [employer] interpreter Mr. Adger related that he is continuing to have some right-side chest pain but over all feels much batter than when he was first admitted 1 week ago.  He is most concerned with the persistent cough.  ? ? ?Current Activity/ Functional Status: ?Patient is independent with mobility/ambulation, transfers, ADL's, IADL's. ?  ?Zubrod Score: ?At the time of surgery this patient?s most appropriate activity status/level should be described as: ?[]     0    Normal activity, no symptoms ?[]     1    Restricted in physical strenuous activity but ambulatory, able to do out light work ?[x]     2    Ambulatory and capable of self care, unable to do work activities, up and about                 more than 50%  Of the time                            ?[]     3    Only limited self care, in bed greater than 50% of waking  hours ?[]     4    Completely disabled, no self care, confined to bed or chair ?[]     5    Moribund ? ?History reviewed. No pertinent past medical history. ? ?History reviewed. No pertinent surgical history. ? ?Social History  ? ?Tobacco Use  ?Smoking Status Never  ?Smokeless Tobacco Never  ?  ?Social History  ? ?Substance and Sexual Activity  ?Alcohol Use Yes  ? Comment: per pt during celebrations, per brother often almost daily  ? ? ? ?No Known Allergies ? ?Current Facility-Administered Medications  ?Medication Dose Route Frequency Provider Last Rate Last Admin  ? acetaminophen (TYLENOL) tablet 650 mg   650 mg Oral Q6H PRN , DO   650 mg at 09/19/21 1542  ? alteplase (CATHFLO ACTIVASE) 10 mg in sodium chloride (PF) 0.9 % 30 mL  10 mg Intrapleural Once Hillary Bow, MD      ? And  ? dornase alfa (PULMOZYME) 5 mg in sterile water (preservative free) 30 mL  5 mg Intrapleural Once 09/21/21, MD      ? Ampicillin-Sulbactam (UNASYN) 3 g in sodium chloride 0.9 % 100 mL IVPB  3 g Intravenous Q6H Adhikari, Amrit, MD 200 mL/hr at 09/20/21 0851 3 g at 09/20/21 0851  ? chlorpheniramine-HYDROcodone 10-8 MG/5ML suspension 5 mL  5 mL Oral Q12H 09/22/21, MD   5 mL at 09/20/21 0852  ? folic acid (FOLVITE) tablet 1 mg  1 mg Oral Daily Burnadette Pop M, DO   1 mg at 09/20/21 M  ? guaiFENesin-dextromethorphan (ROBITUSSIN DM) 100-10 MG/5ML syrup 5 mL  5 mL Oral Q4H PRN 09/22/21, MD      ? HYDROmorphone (DILAUDID) injection 1 mg  1 mg Intravenous Q4H PRN 5916, MD   1 mg at 09/20/21 0550  ? multivitamin with minerals tablet 1 tablet  1 tablet Oral Daily Burnadette Pop, DO   1 tablet at 09/20/21 Hillary Bow  ? oxyCODONE (Oxy IR/ROXICODONE) immediate release tablet 5 mg  5 mg Oral Q6H PRN 09/22/21, MD   5 mg at 09/20/21 Burnadette Pop  ? sodium chloride flush (NS) 0.9 % injection 10 mL  10 mL Other Q8H Mannam, Praveen, MD   10 mL at 09/20/21 1101  ? sodium chloride flush (NS) 0.9 % injection 10 mL  10 mL Other Q8H Mannam, Praveen, MD   10 mL at 09/20/21 1100  ? thiamine tablet 100 mg  100 mg Oral Daily 09/22/21 M, DO   100 mg at 09/20/21 Lyda Perone  ? Or  ? thiamine (B-1) injection 100 mg  100 mg Intravenous Daily 09/22/21, DO      ? ? ?No medications prior to admission.  ? ? ?History reviewed. No pertinent family history. ? ? ?Review of Systems:  ? ? ?  Cardiac Review of Systems: Y or  [    ]= no ? Chest Pain [  x  ]  Resting SOB [   ] Exertional SOB  [ x ]  Orthopnea [  ] ?  Pedal Edema [   ]    Palpitations [  ] Syncope  [  ]   Presyncope [   ] ? General Review of Systems: [Y] = yes [   ]=no ?Constitional: recent weight change [  ]; anorexia [  ]; fatigue [  ]; nausea [  ]; night sweats [  ]; fever [ x ]; or chills [  ]                                                               ?  Eye : blurred vision [  ]; diplopia [   ]; vision changes [  ];  Amaurosis fugax[  ]; ?Resp: cough [ x ];  wheezing[  ];  hemoptysis[  ]; shortness of breath[ x ]; paroxysmal nocturnal dyspnea[  ]; dyspnea on exertion[  ]; or orthopnea[  ];  ?GI:  gallstones[  ], vomiting[  ];  dysphagia[  ]; melena[  ];  hematochezia [  ]; heartburn[  ];   Hx of  Colonoscopy[  ]; ?GU: kidney stones [  ]; hematuria[  ];   dysuria [  ];  nocturia[  ];  history of     obstruction [  ]; urinary frequency [  ] ?            Skin: rash, swelling[  ];, hair loss[  ];  peripheral edema[  ];  or itching[  ]; ?Musculosketetal: myalgias[  ];  joint swelling[  ];  joint erythema[  ]; ? joint pain[  ];  back pain[  ]; ? Heme/Lymph: bruising[  ];  bleeding[  ];  anemia[  ];  ?Neuro: TIA[  ];  headaches[  ];  stroke[  ];  vertigo[  ];  seizures[  ];   paresthesias[  ];  difficulty walking[  ]; ? Psych:depression[  ]; anxiety[  ]; ? Endocrine: diabetes[  ];  thyroid dysfunction[  ]; ?   ? ?Physical Exam: ?BP (!) 130/91 (BP Location: Left Arm)   Pulse 75   Temp 99.5 ?F (37.5 ?C) (Oral)   Resp 18   Ht 5\' 7"  (1.702 m)   Wt 78.9 kg   SpO2 97%   BMI 27.24 kg/m?  ? ? ?General appearance: alert, cooperative, and moderate distress ?Head: Normocephalic, without obvious abnormality, atraumatic ?Neck: no adenopathy, no carotid bruit, no JVD, and supple, symmetrical, trachea midline ?Lymph nodes: Cervical, supraclavicular, and axillary nodes normal. ?Resp: Breath sounds clear but diminished on the right, clear on the left. He is mildly tachypneic  ?Cardio: RRR, heart sounds are hyperdynamic, no murmur ?GI: soft, NT, active bowel sounds, he has an easily reducible umbilical hernia.  ?Extremities: extremities normal, atraumatic, no cyanosis or  edema ?Neurologic: Grossly normal ? ?Diagnostic Studies & Laboratory data: ?   ? Recent Radiology Findings:  ? CT CHEST WO CONTRAST ? ?Result Date: 09/19/2021 ?CLINICAL DATA:  Empyema, status post tube drainage EXAM: CT CHEST WITHOUT

## 2021-09-20 NOTE — Progress Notes (Signed)
?PROGRESS NOTE ? ?Calvin Moore  DTO:[id number] DOB: [birth year] DOA: 09/13/2021 ?PCP: Patient, No Pcp Per (Inactive)  ? ?Brief Narrative: ?Patient is a 26 year old male with history of chronic alcohol abuse who presented to the emergency department with complaints of right-sided chest pain, right upper quadrant pain.  Pain was worse with deep inspiration/coughing, he was also complaining of shortness of breath.  Chest imaging showed multifocal pneumonia.  Started on IV antibiotics for CAP.  Hospital course remarkable for persistent fever, though he does not require oxygen.  CT chest done on 3/24 showed worsening pneumonia,possible  posterior right upper lobe pulmonary necrosis,moderate locular right pleural effusion, increased.  PCCM consulted and following.  Underwent  chest tube placement on 3/25.  Hospital course remarkable for persistent loculated moderate to large right-sided pleural effusion despite being on chest tube.  CT surgery consulted today.  Discharge plan after chest tube removal ? ?Assessment & Plan: ? ?Principal Problem: ?  Multifocal pneumonia ?Active Problems: ?  Pericardial effusion ?  Splenomegaly ?  Thrombocytopenia (HCC) ?  Alcohol use ?  Hospital acquired PNA ?  Hypokalemia ?  Pleural effusion ? ? ?Assessment and Plan: ?* Multifocal pneumonia ?Presented with shortness of breath, cough, right-sided chest pain, right upper quadrant pain.  CT chest on admission showed right upper lobe consolidation and patchy right lower lobe airspace disease suggesting multifocal pneumonia. Also showed ill-defined nodular densities in the left upper lobe likely infectious/inflammatory. ?His chest pain is pleuritic in nature and is associated with pneumonia. ?On unaysn. ?Hospital course remarkable for persistent fever, though he does not require oxygen.  CT chest done on 3/24 showed worsening pneumonia,possible  posterior right upper lobe pulmonary necrosis,moderate locular right pleural effusion, increased.   PCCM consulted. S/P chest tube placement. ?Follow-up chest x-ray/CT chest has shown large, loculated right pleural effusion.Dense, masslike partially cavitary lesion of the posterior right ?upper lobe measuring 7.1 x 7.0 cm. ?Status post DNA lytics x2. ?CT surgery consulted today ?Continue Dilaudid for severe pleuritic chest pain.  No leukocytosis, blood pressure stable.  Follow-up pleural fluid culture:hasnot shown any organism ? ?Alcohol use ?Drinks heavily as per the brother.  Started on CIWA protocol.  Continue thiamine and folic acid.  Right upper quadrant ultrasound shows possible cirrhosis. Counseled for cessation . ? ?Thrombocytopenia (HCC) ?Likely associated with splenomegaly/cirrhosis .  Continue to monitor ? ?Splenomegaly ?Likely alcohol related.  Has thrombocytopenia ? ?Pericardial effusion ?Chest CT showed small to moderate pericardial effusion.  Patient is asymptomatic.  Echocardiogram showed very small pericardial effusion, no evidence of tamponade, normal left ventricle ejection fraction ? ?Hypokalemia ?Supplemented with potassium ? ? ? ? ? ? ?  ?  ? ?DVT prophylaxis:Place and maintain sequential compression device Start: 09/14/21 0341 ? ? ?  Code Status: Full Code ? ?Family Communication: Family  at bedside on 3/28, interpreter used ? ?Patient status:Inpatient  ? ?Patient is from :Home ? ?Anticipated discharge XI:[facility] ? ?Estimated DC date:after chest tube removal ? ? ?Consultants: None ? ?Procedures:None ? ?Antimicrobials:  ?Anti-infectives (From admission, onward)  ? ? Start     Dose/Rate Route Frequency Ordered Stop  ? 09/16/21 2000  Ampicillin-Sulbactam (UNASYN) 3 g in sodium chloride 0.9 % 100 mL IVPB       ? 3 g ?200 mL/hr over 30 Minutes Intravenous Every 6 hours 09/16/21 1751 09/21/21 1959  ? 09/14/21 2200  cefTRIAXone (ROCEPHIN) 2 g in sodium chloride 0.9 % 100 mL IVPB  Status:  Discontinued       ? 2 g ?  200 mL/hr over 30 Minutes Intravenous Every 24 hours 09/13/21 2233 09/16/21 1747  ?  09/14/21 2200  azithromycin (ZITHROMAX) 500 mg in sodium chloride 0.9 % 250 mL IVPB  Status:  Discontinued       ? 500 mg ?250 mL/hr over 60 Minutes Intravenous Every 24 hours 09/13/21 2233 09/16/21 1747  ? 09/13/21 2015  cefTRIAXone (ROCEPHIN) 1 g in sodium chloride 0.9 % 100 mL IVPB       ? 1 g ?200 mL/hr over 30 Minutes Intravenous  Once 09/13/21 2012 09/13/21 2143  ? 09/13/21 2015  azithromycin (ZITHROMAX) 500 mg in sodium chloride 0.9 % 250 mL IVPB       ? 500 mg ?250 mL/hr over 60 Minutes Intravenous  Once 09/13/21 2012 09/13/21 2244  ? ?  ? ? ?Subjective: ? ?Patient seen and examined at bedside this morning.  Again had mild grade fever this morning.  He says his chest pain and coughing are better ? ?Objective: ?Vitals:  ? 09/19/21 1544 09/19/21 1637 09/19/21 2016 09/20/21 0804  ?BP: 137/89  (!) 145/81 (!) 146/78  ?Pulse: 93  85 75  ?Resp: 20  19 17   ?Temp:  100.3 ?F (37.9 ?C) 98.3 ?F (36.8 ?C) 100.3 ?F (37.9 ?C)  ?TempSrc:  Oral Oral Oral  ?SpO2: 96%  98% 95%  ?Weight:      ?Height:      ? ? ?Intake/Output Summary (Last 24 hours) at 09/20/2021 1013 ?Last data filed at 09/20/2021 0551 ?Gross per 24 hour  ?Intake 1703.56 ml  ?Output 1320 ml  ?Net 383.56 ml  ? ?Filed Weights  ? 09/14/21 0305  ?Weight: 78.9 kg  ? ? ?Examination: ? ?General exam: Overall comfortable, not in distress ?HEENT: PERRL ?Respiratory system:  no wheezes or crackles , left-sided chest tube, diminished air sounds on the right side ?Cardiovascular system: S1 & S2 heard, RRR.  ?Gastrointestinal system: Abdomen is nondistended, soft and nontender. ?Central nervous system: Alert and oriented ?Extremities: No edema, no clubbing ,no cyanosis ?Skin: No rashes, no ulcers,no icterus   ? ? ?Data Reviewed: I have personally reviewed following labs and imaging studies ? ?CBC: ?Recent Labs  ?Lab 09/13/21 ?1647 09/14/21 ?0036 09/14/21 ?0503 09/16/21 ?0404 09/17/21 ?1005 09/18/21 ?[id number]  ?WBC 9.1 7.6  --  2.5* 3.7* 4.0  ?NEUTROABS 7.4  --   --  1.7 2.4 3.0   ?HGB 13.7 12.8*  --  11.4* 11.8* 11.0*  ?HCT 42.8 38.8*  --  33.9* 35.5* 32.8*  ?MCV 85.4 85.3  --  83.1 82.8 82.6  ?PLT PLATELET CLUMPS NOTED ON SMEAR, UNABLE TO ESTIMATE 63* 64* 56* 67* 74*  ? ?Basic Metabolic Panel: ?Recent Labs  ?Lab 09/13/21 ?1647 09/14/21 ?0036 09/16/21 ?0404 09/17/21 ?1005 09/18/21 ?[id number]  ?NA 136 134* 135 135 134*  ?K 3.7 3.5 3.1* 3.7 3.8  ?CL 98 99 102 105 103  ?CO2 27 26 25  21* 20*  ?GLUCOSE 121* 104* 186* 101* 102*  ?BUN 13 11 6  5* 6  ?CREATININE 1.16 1.13 1.07 0.92 0.94  ?CALCIUM 9.3 8.8* 8.1* 8.4* 8.0*  ? ? ? ?Recent Results (from the past 240 hour(s))  ?Resp Panel by RT-PCR (Flu A&B, Covid) Nasopharyngeal Swab     Status: None  ? Collection Time: 09/14/21 12:15 AM  ? Specimen: Nasopharyngeal Swab; Nasopharyngeal(NP) swabs in vial transport medium  ?Result Value Ref Range Status  ? SARS Coronavirus 2 by RT PCR NEGATIVE NEGATIVE Final  ?  Comment: (NOTE) ?SARS-CoV-2 target nucleic acids are NOT  DETECTED. ? ?The SARS-CoV-2 RNA is generally detectable in upper respiratory ?specimens during the acute phase of infection. The lowest ?concentration of SARS-CoV-2 viral copies this assay can detect is ?138 copies/mL. A negative result does not preclude SARS-Cov-2 ?infection and should not be used as the sole basis for treatment or ?other patient management decisions. A negative result may occur with  ?improper specimen collection/handling, submission of specimen other ?than nasopharyngeal swab, presence of viral mutation(s) within the ?areas targeted by this assay, and inadequate number of viral ?copies(<138 copies/mL). A negative result must be combined with ?clinical observations, patient history, and epidemiological ?information. The expected result is Negative. ? ?Fact Sheet for Patients:  ?BloggerCourse.com ? ?Fact Sheet for Healthcare Providers:  ?SeriousBroker.it ? ?This test is no t yet approved or cleared by the [place] FDA and   ?has been authorized for detection and/or diagnosis of SARS-CoV-2 by ?FDA under an Emergency Use Authorization (EUA). This EUA will remain  ?in effect (meaning this test can be used) for the duration of the ?C

## 2021-09-20 NOTE — Progress Notes (Signed)
Mobility Specialist: Progress Note ? ? 09/20/21 1220  ?Mobility  ?Activity Ambulated independently in hallway  ?Level of Assistance Independent after set-up  ?Assistive Device None  ?Distance Ambulated (ft) 1900 ft  ?Activity Response Tolerated well  ?$Mobility charge 1 Mobility  ? ?Pt received in bed and agreeable to ambulation. C/o some chest pain when coughing, otherwise asymptomatic. Pt sitting EOB after session with call bell and phone in reach. Family present in the room.  ? ?Cristal Deer Romelle Muldoon ?Mobility Specialist ?Mobility Specialist 5 North: [phone] ?Mobility Specialist 6 North: [phone] ? ?

## 2021-09-20 NOTE — Procedures (Signed)
Pleural Fibrinolytic Administration Procedure Note ? ?Calvin Moore  ?[id number]  ?Nov 25, 1995 ? ?Date:09/20/21  ?Time:3:00 PM  ? ?Provider Performing:Amberrose Friebel  ? ?Procedure: Pleural Fibrinolysis Subsequent day (40981) ? ?Indication(s) ?Fibrinolysis of complicated pleural effusion ? ?Consent ?Risks of the procedure as well as the alternatives and risks of each were explained to the patient and/or caregiver.  Consent for the procedure was obtained. ? ? ?Anesthesia ?None ? ? ?Time Out ?Verified patient identification, verified procedure, site/side was marked, verified correct patient position, special equipment/implants available, medications/allergies/relevant history reviewed, required imaging and test results available. ? ? ?Sterile Technique ?Hand hygiene, gloves ? ? ?Procedure Description ?Existing pleural catheter was cleaned and accessed in sterile manner.  10mg  of tPA in 30cc of saline and 5mg  of dornase in 30cc of sterile water were injected into pleural space using existing pleural catheter.  Catheter will be clamped for 1 hour and then placed back to suction. ? ? ?Complications/Tolerance ?None; patient tolerated the procedure well. ? ? ?EBL ?None ? ? ?Specimen(s) ?None ? ? MD ?[facility] ?See Amion for pager ? ?If no response to pager , please call [phone] until 7pm ?After 7:00 pm call Elink  [phone] ?09/20/2021, 3:01 PM  ? ?

## 2021-09-21 ENCOUNTER — Inpatient Hospital Stay (HOSPITAL_COMMUNITY): Payer: BC Managed Care – PPO

## 2021-09-21 DIAGNOSIS — I3139 Other pericardial effusion (noninflammatory): Secondary | ICD-10-CM

## 2021-09-21 DIAGNOSIS — Y95 Nosocomial condition: Secondary | ICD-10-CM

## 2021-09-21 LAB — BASIC METABOLIC PANEL
Anion gap: 9 (ref 5–15)
BUN: 5 mg/dL — ABNORMAL LOW (ref 6–20)
CO2: 22 mmol/L (ref 22–32)
Calcium: 8.3 mg/dL — ABNORMAL LOW (ref 8.9–10.3)
Chloride: 100 mmol/L (ref 98–111)
Creatinine, Ser: 0.97 mg/dL (ref 0.61–1.24)
GFR, Estimated: >60 mL/min (ref >60)
Glucose, Bld: 106 mg/dL — ABNORMAL HIGH (ref 70–99)
Potassium: 4.9 mmol/L (ref 3.5–5.1)
Sodium: 131 mmol/L — ABNORMAL LOW (ref 135–145)

## 2021-09-21 LAB — CBC WITH DIFFERENTIAL/PLATELET
Abs Immature Granulocytes: 0.09 10*3/uL — ABNORMAL HIGH (ref 0.00–0.07)
Basophils Absolute: 0 10*3/uL (ref 0.0–0.1)
Basophils Relative: 1 %
Eosinophils Absolute: 0 10*3/uL (ref 0.0–0.5)
Eosinophils Relative: 0 %
HCT: 34.3 % — ABNORMAL LOW (ref 39.0–52.0)
Hemoglobin: 11.2 g/dL — ABNORMAL LOW (ref 13.0–17.0)
Immature Granulocytes: 2 %
Lymphocytes Relative: 15 %
Lymphs Abs: 0.9 10*3/uL (ref 0.7–4.0)
MCH: 27.3 pg (ref 26.0–34.0)
MCHC: 32.7 g/dL (ref 30.0–36.0)
MCV: 83.5 fL (ref 80.0–100.0)
Monocytes Absolute: 0.4 10*3/uL (ref 0.1–1.0)
Monocytes Relative: 7 %
Neutro Abs: 4.7 10*3/uL (ref 1.7–7.7)
Neutrophils Relative %: 75 %
Platelets: 171 10*3/uL (ref 150–400)
RBC: 4.11 MIL/uL — ABNORMAL LOW (ref 4.22–5.81)
RDW: 15.8 % — ABNORMAL HIGH (ref 11.5–15.5)
WBC: 6.1 10*3/uL (ref 4.0–10.5)
nRBC: 0 % (ref 0.0–0.2)

## 2021-09-21 LAB — QUANTIFERON-TB GOLD PLUS: QuantiFERON-TB Gold Plus: UNDETERMINED — AB

## 2021-09-21 LAB — QUANTIFERON-TB GOLD PLUS (RQFGPL)
QuantiFERON Mitogen Value: 0.34 IU/mL
QuantiFERON Nil Value: 0.08 IU/mL
QuantiFERON TB1 Ag Value: 0.08 IU/mL
QuantiFERON TB2 Ag Value: 0.09 IU/mL

## 2021-09-21 MED ORDER — NAPROXEN 250 MG PO TABS
500.0000 mg | ORAL_TABLET | Freq: Three times a day (TID) | ORAL | Status: DC
  Administered 2021-09-21 – 2021-09-24 (×9): 500 mg via ORAL
  Filled 2021-09-21 (×9): qty 2

## 2021-09-21 MED ORDER — SODIUM CHLORIDE 0.9% FLUSH
10.0000 mL | Freq: Three times a day (TID) | INTRAVENOUS | Status: DC
  Administered 2021-09-21 – 2021-09-22 (×4): 10 mL

## 2021-09-21 MED ORDER — SODIUM CHLORIDE 0.9 % IV SOLN
3.0000 g | Freq: Four times a day (QID) | INTRAVENOUS | Status: DC
  Administered 2021-09-21 – 2021-09-23 (×9): 3 g via INTRAVENOUS
  Filled 2021-09-21 (×9): qty 8

## 2021-09-21 MED ORDER — STERILE WATER FOR INJECTION IJ SOLN
5.0000 mg | Freq: Once | RESPIRATORY_TRACT | Status: AC
  Administered 2021-09-21: 5 mg via INTRAPLEURAL
  Filled 2021-09-21: qty 5

## 2021-09-21 MED ORDER — OXYCODONE HCL 5 MG PO TABS
5.0000 mg | ORAL_TABLET | ORAL | Status: DC | PRN
  Administered 2021-09-21 – 2021-09-22 (×2): 5 mg via ORAL
  Filled 2021-09-21 (×2): qty 1

## 2021-09-21 MED ORDER — SODIUM CHLORIDE (PF) 0.9 % IJ SOLN
10.0000 mg | Freq: Once | INTRAMUSCULAR | Status: AC
  Administered 2021-09-21: 10 mg via INTRAPLEURAL
  Filled 2021-09-21: qty 10

## 2021-09-21 NOTE — Progress Notes (Signed)
? ?   ?  [address] ?      [place] [address] ?            [phone]   ? ?  ?Progress notes, imaging, and cultures reviewed. ?T-spike again last night to 101.2. and  cough with pleuritic pain persist.  ?Cultures remain negative but [name]-TB test was not definitive. AFB smear negative, AFB culture pending.  ?CXR showing slight decrease in the right pleural fluid collection and also some improvement in RUL aeration.  ?Had 3rd pleural fibrinolysis today.  ?No new recommendations from CT surgery standpoint. We will continue to follow.  ? ?M. Vonette Grosso, PA-C ? ? ?

## 2021-09-21 NOTE — Progress Notes (Addendum)
PROGRESS NOTE   Clydell Mahannah  ZOX:[id number] DOB: 01-Feb-1996 DOA: 09/13/2021 PCP: Patient, No Pcp Per (Inactive)  Brief Narrative:  26 year old [place] male Prior heavy EtOH use Presented 09/13/2021 significant chest pain SOB TCP splenomegaly?  Cirrhosis 3/24-still febrile repeat CT = worsening multifocal pneumonia + RUL abscess moderate loculated-CCM consulted 3/25 IR guided chest tube placed--1 L out 3/27 DNase/tPA first dose--800 cc out 3/28 cardiovascular surgery consulted 2/27X 7 cm RUL masslike partial cavitary lesion-recommending conservative management Pleural fibrinolysis performed again with dornase  Hospital-Problem based course  Necrotizing cavitary pneumonia-low suspicion per PCCM for TB Legionella, strep pneumo, respiratory viral pathogen panel negative Antibiotics tailored to Unasyn-expectant management will need at least 3 to 4 weeks of antibiotics total based on imaging as per specialists Last CXR 3/20 9 AM R hydropneumothorax with interval decrease in fluid component Continue Tussionex 5 mill every 12, Robitussin every 4 as needed pleuritic pain Pleuritic chest pain Can use Dilaudid for severe pain every 4 as needed-increase oxycodone to every 4 as needed to use enteric system preferentially Start scheduled Naprosyn 500 3 times daily short-term ?  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 as INR was slightly elevated earlier in admission Mild hyponatremia and resolved hypokalemia Patient to be encouraged to take in oral supplements etc.  DVT prophylaxis: SCD Code Status: Full code presumed Family  Communication:  Disposition:  Status is: Inpatient Remains inpatient appropriate because: Continued medical work-up and on IV therapies at this time   Consultants:  Critical care medicine Interventional radiology  Procedures: As above  Antimicrobials: As above   Subjective:-Interviewed with telemetry interpreter  Awake coherent and severe pain-seems to have fallen off schedule Many questions about when he will be able to go home which I  answered to the best my ability   He does not seem very happy awake and seems to be in pain  his brother-in-law is at the bedside and he seems to understand the plan of care  We explained that this is a protracted likely course  Objective: Vitals:   09/20/21 1145 09/20/21 1957 09/20/21 2329 09/21/21 0736  BP: (!) 130/91 105/62 111/85 108/64  Pulse: 75 95 91 74  Resp: 18 16 18 18   Temp: 99.5 F (37.5 C) 98.9 F (37.2 C) (!) 101.2 F (38.4 C) 99.7 F (37.6 C)  TempSrc: Oral  Oral Oral  SpO2: 97% 94% 97% 97%  Weight:      Height:        Intake/Output Summary (Last 24 hours) at 09/21/2021 1001 Last data filed at 09/21/2021 0600 Gross per 24 hour  Intake --  Output 503 ml  Net -503 ml   Filed Weights   09/14/21 0305  Weight: 78.9 kg    Examination:  No distress EOMI NCAT no focal deficit Moving 4 limbs equally Decreased air entry posterolaterally on the left side He is warm to touch S1-S2 no murmurs slightly tacky ROM intact moving 4 limbs equally but is weak   Data Reviewed: personally reviewed   CBC    Component Value Date/Time   WBC 6.1 09/21/2021 0243   RBC 4.11 (L) 09/21/2021 0243   HGB  11.2 (L) 09/21/2021 0243   HCT 34.3 (L) 09/21/2021 0243   PLT 171 09/21/2021 0243   MCV 83.5 09/21/2021 0243   MCH 27.3 09/21/2021 0243   MCHC 32.7 09/21/2021 0243   RDW 15.8 (H) 09/21/2021 0243   LYMPHSABS 0.9 09/21/2021 0243   MONOABS 0.4 09/21/2021 0243   EOSABS 0.0 09/21/2021 0243   BASOSABS 0.0 09/21/2021 0243       Latest Ref Rng & Units 09/21/2021    2:43 AM 09/18/2021    3:39 AM 09/17/2021   10:05 AM  CMP  Glucose 70 - 99 mg/dL 846   962   952    BUN 6 - 20 mg/dL 5   6   5     Creatinine 0.61 - 1.24 mg/dL 8.41   3.24   4.01    Sodium 135 - 145 mmol/L 131   134   135    Potassium 3.5 - 5.1 mmol/L 4.9   3.8   3.7    Chloride 98 - 111 mmol/L 100   103   105    CO2 22 - 32 mmol/L 22   20   21     Calcium 8.9 - 10.3 mg/dL 8.3   8.0   8.4    Total Protein 6.5 - 8.1 g/dL   5.9       Radiology Studies: CT CHEST WO CONTRAST  Result Date: 09/19/2021 CLINICAL DATA:  Empyema, status post tube drainage EXAM: CT CHEST WITHOUT CONTRAST TECHNIQUE: Multidetector CT imaging of the chest was performed following the standard protocol without IV contrast. RADIATION DOSE REDUCTION: This exam was performed according to the departmental dose-optimization program which includes automated exposure control, adjustment of the mA and/or kV according to patient size and/or use of iterative reconstruction technique. COMPARISON:  09/16/2021 FINDINGS: Cardiovascular: No significant vascular findings. Normal heart size. No pericardial effusion. Mediastinum/Nodes: No enlarged mediastinal, hilar, or axillary lymph nodes. Thyroid gland, trachea, and esophagus demonstrate no significant findings. Lungs/Pleura: Redemonstrated large, loculated right pleural effusion. There has been interval placement of a pigtail drainage tube about the right lung base, with slight reduction in fluid volume and introduction of loculated pneumothorax components within the pleural effusion about the right lung base (series 3, image 100). Trace left pleural effusion, unchanged. Dense, masslike partially cavitary lesion of the posterior right upper lobe measuring 7.1 x 7.0 cm, not significantly changed (series 4, image 37). Upper Abdomen: No acute abnormality. Coarse, nodular cirrhotic morphology of the liver. Partially imaged splenomegaly. Musculoskeletal: No  chest wall abnormality. No suspicious osseous lesions identified. IMPRESSION: 1. Redemonstrated large, loculated right pleural effusion. There has been interval placement of a pigtail drainage tube about the right lung base, with slight reduction in fluid volume and introduction of loculated pneumothorax components within the pleural effusion about the right lung base. 2. Trace left pleural effusion, unchanged. 3. Dense, masslike partially cavitary lesion of the posterior right upper lobe measuring 7.1 x 7.0 cm, not significantly changed and most consistent with cavitary infection. 4. Cirrhosis and partially imaged splenomegaly in the included upper abdomen. Electronically Signed   By: Jearld Lesch M.D.   On: 09/19/2021 10:54   DG CHEST PORT 1 VIEW  Result Date: 09/21/2021 CLINICAL DATA:  Loculated pleural effusion. EXAM: PORTABLE CHEST 1 VIEW COMPARISON:  Chest x-ray dated September 21, 2021 FINDINGS: Cardiac and mediastinal contours are unchanged. Slightly decreased size of loculated right pleural effusion. Unchanged elevation of the right hemidiaphragm. Shifting of air with interval resolution of right basilar  pneumothorax and new trace right apical pneumothorax. IMPRESSION: Right hydropneumothorax with interval decreased size of fluid component and small associated right apical pneumothorax. Chest tube in place. Electronically Signed   By: Allegra Lai M.D.   On: 09/21/2021 08:33   DG CHEST PORT 1 VIEW  Result Date: 09/20/2021 CLINICAL DATA:  Provided history: Loculated pleural effusion. EXAM: PORTABLE CHEST 1 VIEW COMPARISON:  CT chest 09/19/2021. Prior chest radiographs 09/18/2021 and earlier. FINDINGS: Unchanged position of a pleural pigtail catheter projecting at the right lung base. Similar appearance of a large loculated right pleural effusion. Small pneumothorax component at the right lung base, similar to the chest CT performed yesterday. Superimposed opacity within the right upper lobe  compatible with the dense, masslike partially cavitary lesion noted at this site on the prior chest CT. Additional opacity at the right lung base likely reflecting the compressive atelectasis noted on yesterday's chest CT. Persistent small left pleural effusion. IMPRESSION: Right pleural pigtail catheter projecting at the right lung base, unchanged. Similar appearance of a large loculated right pleural effusion. Small pneumothorax component at the right lung base, similar to the chest CT performed yesterday. Superimposed opacity within the right upper lobe compatible with the dense, masslike partially cavitary lesion noted at this site on the prior chest CT. Please refer to this prior report for further description. Additional opacity at the right lung base, likely reflecting compressive atelectasis. Persistent small left pleural effusion. Electronically Signed   By: Jackey Loge D.O.   On: 09/20/2021 08:25     Scheduled Meds:  alteplase (TPA) for intrapleural administration  10 mg Intrapleural Once   And   pulmozyme (DORNASE) for intrapleural administration  5 mg Intrapleural Once   chlorpheniramine-HYDROcodone  5 mL Oral Q12H   naproxen  500 mg Oral TID WC   sodium chloride flush  10 mL Other Q8H   sodium chloride flush  10 mL Other Q8H   sodium chloride flush  10 mL Other Q8H   Continuous Infusions:  ampicillin-sulbactam (UNASYN) IV 3 g (09/21/21 0159)     LOS: 7 days   Time spent: 8  Rhetta Mura, MD [facility] To contact the attending provider between 7A-7P or the covering provider during after hours 7P-7A, please log into the web site [email or web address] and access using universal [place] password for that web site. If you do not have the password, please call the hospital operator.  09/21/2021, 10:01 AM

## 2021-09-21 NOTE — Progress Notes (Signed)
CT dressing changed and flushed ?

## 2021-09-21 NOTE — Procedures (Signed)
Pleural Fibrinolytic Administration Procedure Note ? ?Calvin Moore  ?[id number]  ?03/14/1996 ? ?Date:09/21/21  ?Time:11:57 AM  ? ?Provider Performing:Takerra Lupinacci  Cyndie Chime  ? ?Procedure: Pleural Fibrinolysis Subsequent day (40086) ? ?Indication(s) ?Fibrinolysis of complicated pleural effusion ? ?Consent ?Risks of the procedure as well as the alternatives and risks of each were explained to the patient and/or caregiver.  Consent for the procedure was obtained. ? ? ?Anesthesia ?None ? ? ?Time Out ?Verified patient identification, verified procedure, site/side was marked, verified correct patient position, special equipment/implants available, medications/allergies/relevant history reviewed, required imaging and test results available. ? ? ?Sterile Technique ?Hand hygiene, gloves ? ? ?Procedure Description ?Existing pleural catheter was cleaned and accessed in sterile manner.  10mg  of tPA in 30cc of saline and 5mg  of dornase in 30cc of sterile water were injected into pleural space using existing pleural catheter.  Catheter will be clamped for 1 hour and then placed back to suction. ? ? ?Complications/Tolerance ?None; patient tolerated the procedure well. ? ?EBL ?None ? ? ?Specimen(s) ?None  ?

## 2021-09-21 NOTE — Progress Notes (Signed)
? ?  NAME:  Calvin Moore, MRN:  [id number], DOB:  03-15-1996, LOS: 7 ?ADMISSION DATE:  09/13/2021, CONSULTATION DATE:  09/16/21 ?REFERRING MD:  Dr. Renford Dills, CHIEF COMPLAINT:  09/16/21  ? ?History of Present Illness:  ?26 year old male with prior hx of heavy ETOH abuse who presented on 3/21 with two day history of progressively worse right sided chest pain and RUQ abdominal pain with associated shortness of breath.  CTA PE neg but showed multifocal pneumonia with small partially loculated right pleural effusion.  Additionally found to be thrombocytopenic with splenomegaly and suspected cirrhosis. Admitted to [facility] and started on azithromycin and ceftriaxone.  Patient continued to be febrile, therefore CT chest was rechecked which showed worsening multifocal pneumonia, right worse than left, and suggestive of pulmonary necrosis in right upper lobe with increased loculated pleural effusion, moderate size now.  He remains on room air.  Pulmonary consulted for further recommendations.  ? ?Pertinent  Medical History  ?ETOH abuse  ? ?Significant Hospital Events: ?Including procedures, antibiotic start and stop dates in addition to other pertinent events   ?3/21 admitted [facility] w/ multifocal pna, small partially loculated R effusion, thrombocytopenia, splenomegaly on azithro/ ceftriaxone. COVID, flu, RVP negative ?3/24 still febrile, repeat CT chest > worsening multifocal pna R>L with concern for RUL abscess, mod loculated R effusion.  Transitioned to Unasyn.  PCCM consulted.  ?3/25 IR CT guided chest tube placed  ?3/27 DNase/tPA first dose ? ?Interim History / Subjective:  ? ?Had around 500 cc output after DNase/tPA 2nd dose.  Chest x-ray shows persistent loculated effusion.  Febrile to 101 today ? ?Objective   ?Blood pressure 108/64, pulse 74, temperature 99.7 ?F (37.6 ?C), temperature source Oral, resp. rate 18, height 5\' 7"  (1.702 m), weight 78.9 kg, SpO2 97 %. ?   ?   ? ?Intake/Output Summary (Last 24 hours) at 09/21/2021  1219 ?Last data filed at 09/21/2021 0600 ?Gross per 24 hour  ?Intake --  ?Output 503 ml  ?Net -503 ml  ? ?Filed Weights  ? 09/14/21 0305  ?Weight: 78.9 kg  ? ? ?Physical exam: ?General: Acutely ill-appearing male, lying on the bed ?HEENT: [place]/AT, eyes anicteric.  moist mucus membranes ?Neuro: Alert, awake following commands ?Chest: R sided pigtail chest tube in place, coarse breath sounds, no wheezes or rhonchi ?Heart: Regular rate and rhythm, no murmurs or gallops ?Abdomen: Soft, nontender, nondistended, bowel sounds present ?Skin: No rash ? ? ?Resolved Hospital Problem list   ? ? ?Assessment & Plan:  ? ?Multifocal Pneumonia right-sided pneumonia involving most of the right upper lobe, also the right lower lobe.  CT imaging consistent with early abscess formation concerning for possible anaerobic infection.   ?Certainly possible and patient with a history of heavy alcohol use. ?Continue antibiotics ? ?Loculated right pleural effusion consistent with a complicated pleural space, likely parapneumonic versus empyema.  He has continued fever and is a high risk for empyema. ? ?Recommendations: ?Continue Unasyn ?Await quantiferon gold  ?CT reviewed with moderate loculated effusion. Will try DNAse lytics 3rd dose today ?Follow CXR in am ?Chest tube care per protocol  ?Pulmonary hygiene -IS, mobilize  ? ?Critical care time: n/a  ? ?09/16/21 MD ?[facility] ?See Amion for pager ?If no response to pager, please call [phone] until 7pm ?After 7pm, Please call E-link [phone] ? ? ? ? ? ?

## 2021-09-22 ENCOUNTER — Inpatient Hospital Stay (HOSPITAL_COMMUNITY): Payer: BC Managed Care – PPO

## 2021-09-22 DIAGNOSIS — R1011 Right upper quadrant pain: Secondary | ICD-10-CM

## 2021-09-22 DIAGNOSIS — R0602 Shortness of breath: Secondary | ICD-10-CM

## 2021-09-22 LAB — CBC WITH DIFFERENTIAL/PLATELET
Abs Immature Granulocytes: 0.1 10*3/uL — ABNORMAL HIGH (ref 0.00–0.07)
Basophils Absolute: 0 10*3/uL (ref 0.0–0.1)
Basophils Relative: 0 %
Eosinophils Absolute: 0 10*3/uL (ref 0.0–0.5)
Eosinophils Relative: 1 %
HCT: 33.1 % — ABNORMAL LOW (ref 39.0–52.0)
Hemoglobin: 10.7 g/dL — ABNORMAL LOW (ref 13.0–17.0)
Lymphocytes Relative: 21 %
Lymphs Abs: 0.7 10*3/uL (ref 0.7–4.0)
MCH: 27 pg (ref 26.0–34.0)
MCHC: 32.3 g/dL (ref 30.0–36.0)
MCV: 83.6 fL (ref 80.0–100.0)
Monocytes Absolute: 0.2 10*3/uL (ref 0.1–1.0)
Monocytes Relative: 6 %
Myelocytes: 2 %
Neutro Abs: 2.2 10*3/uL (ref 1.7–7.7)
Neutrophils Relative %: 70 %
Platelets: 152 10*3/uL (ref 150–400)
RBC: 3.96 MIL/uL — ABNORMAL LOW (ref 4.22–5.81)
RDW: 15.3 % (ref 11.5–15.5)
WBC: 3.2 10*3/uL — ABNORMAL LOW (ref 4.0–10.5)
nRBC: 0 % (ref 0.0–0.2)
nRBC: 2 /100 WBC — ABNORMAL HIGH

## 2021-09-22 LAB — AEROBIC/ANAEROBIC CULTURE W GRAM STAIN (SURGICAL/DEEP WOUND)
Culture: NO GROWTH
Gram Stain: NONE SEEN

## 2021-09-22 LAB — COMPREHENSIVE METABOLIC PANEL
ALT: 88 U/L — ABNORMAL HIGH (ref 0–44)
AST: 79 U/L — ABNORMAL HIGH (ref 15–41)
Albumin: 2 g/dL — ABNORMAL LOW (ref 3.5–5.0)
Alkaline Phosphatase: 66 U/L (ref 38–126)
Anion gap: 7 (ref 5–15)
BUN: 6 mg/dL (ref 6–20)
CO2: 25 mmol/L (ref 22–32)
Calcium: 8 mg/dL — ABNORMAL LOW (ref 8.9–10.3)
Chloride: 102 mmol/L (ref 98–111)
Creatinine, Ser: 0.9 mg/dL (ref 0.61–1.24)
GFR, Estimated: >60 mL/min (ref >60)
Glucose, Bld: 165 mg/dL — ABNORMAL HIGH (ref 70–99)
Potassium: 3.8 mmol/L (ref 3.5–5.1)
Sodium: 134 mmol/L — ABNORMAL LOW (ref 135–145)
Total Bilirubin: 0.5 mg/dL (ref 0.3–1.2)
Total Protein: 5.8 g/dL — ABNORMAL LOW (ref 6.5–8.1)

## 2021-09-22 LAB — PROTIME-INR
INR: 1.4 — ABNORMAL HIGH (ref 0.8–1.2)
Prothrombin Time: 17.4 seconds — ABNORMAL HIGH (ref 11.4–15.2)

## 2021-09-22 NOTE — Progress Notes (Signed)
Pt brother/family member appears intoxicated at bedside.   ?

## 2021-09-22 NOTE — Progress Notes (Signed)
Mobility Specialist Progress Note  ? ? 09/22/21 1143  ?Mobility  ?Activity Ambulated independently in hallway  ?Level of Assistance Modified independent, requires aide device or extra time  ?Assistive Device  ?(IV pole)  ?Distance Ambulated (ft) 1000 ft  ?Activity Response Tolerated well  ?$Mobility charge 1 Mobility  ? ?Pt received in bed and agreeable. No complaints on walk. Returned to sitting EOB with call bell in reach and family present.  ? ?Calvin Moore ?Mobility Specialist  ?  ?

## 2021-09-22 NOTE — Progress Notes (Signed)
? ?  NAME:  Calvin Moore, MRN:  [id number], DOB:  1995/11/13, LOS: 8 ?ADMISSION DATE:  09/13/2021, CONSULTATION DATE:  09/16/21 ?REFERRING MD:  Dr. Renford Dills, CHIEF COMPLAINT:  09/16/21  ? ?History of Present Illness:  ?26 year old male with prior hx of heavy ETOH abuse who presented on 3/21 with two day history of progressively worse right sided chest pain and RUQ abdominal pain with associated shortness of breath.  CTA PE neg but showed multifocal pneumonia with small partially loculated right pleural effusion.  Additionally found to be thrombocytopenic with splenomegaly and suspected cirrhosis. Admitted to [facility] and started on azithromycin and ceftriaxone.  Patient continued to be febrile, therefore CT chest was rechecked which showed worsening multifocal pneumonia, right worse than left, and suggestive of pulmonary necrosis in right upper lobe with increased loculated pleural effusion, moderate size now.  He remains on room air.  Pulmonary consulted for further recommendations.  ? ?Pertinent  Medical History  ?ETOH abuse  ? ?Significant Hospital Events: ?Including procedures, antibiotic start and stop dates in addition to other pertinent events   ?3/21 admitted [facility] w/ multifocal pna, small partially loculated R effusion, thrombocytopenia, splenomegaly on azithro/ ceftriaxone. COVID, flu, RVP negative ?3/24 still febrile, repeat CT chest > worsening multifocal pna R>L with concern for RUL abscess, mod loculated R effusion.  Transitioned to Unasyn.  PCCM consulted.  ?3/25 IR CT guided chest tube placed  ?3/27 DNase/tPA first dose ?3/29 3 rd dose TPA ? ?Interim History / Subjective:  ? ?190 cc x 24 hrs. Cxr pending ? ?Objective   ?Blood pressure 122/76, pulse 81, temperature 98.2 ?F (36.8 ?C), temperature source Oral, resp. rate 20, height 5\' 7"  (1.702 m), weight 78.9 kg, SpO2 98 %. ?   ?   ? ?Intake/Output Summary (Last 24 hours) at 09/22/2021 0828 ?Last data filed at 09/22/2021 0154 ?Gross per 24 hour  ?Intake 1180 ml   ?Output 140 ml  ?Net 1040 ml  ? ?Filed Weights  ? 09/14/21 0305  ?Weight: 78.9 kg  ? ? ?Physical exam: ?General:  wnwd AAM ?HEENT: no jvd ?Neuro: grossly intact ?CV: hsr ?PULM:  rt chest tube 193 cc drainage post TPA  ?GI: soft, bsx4 active  ?09/16/21 ?Extremities: warm/dry,  neg edema  ?Skin: no rashes or lesions ? ? ? ?Resolved Hospital Problem list   ? ? ?Assessment & Plan:  ? ?Multifocal Pneumonia right-sided pneumonia involving most of the right upper lobe, also the right lower lobe.  CT imaging consistent with early abscess formation concerning for possible anaerobic infection.   ?Certainly possible and patient with a history of heavy alcohol use. ? ?Continue abx ?QF gold noted non conclusive ?AFB pending ? ? ?Loculated right pleural effusion consistent with a complicated pleural space, likely parapneumonic versus empyema.  He has continued fever and is a high risk for empyema. ? ?Recommendations: ? ?Unasyn ?Check cxr post 3rd dose TPA on 3/29 ?? If VATS is answer CVTS is following ? ?Critical care time: n/a  ? ?4/29 Brizeyda Holtmeyer ACNP ?Acute Care Nurse Practitioner ?[facility] ?Please consult Amion ?09/22/2021, 8:28 AM ? ? ? ? ? ? ?

## 2021-09-22 NOTE — Progress Notes (Signed)
?PROGRESS NOTE ? ? ?Calvin Moore  HBZ:[id number] DOB: 15-Jun-1996 DOA: 09/13/2021 ?PCP: Patient, No Pcp Per (Inactive)  ?Brief Narrative:  ?26 year old [place] male ?Prior heavy EtOH use ?Presented 09/13/2021 significant chest pain SOB TCP splenomegaly?  Cirrhosis ?3/24-still febrile repeat CT = worsening multifocal pneumonia + RUL abscess moderate loculated-CCM consulted ?3/25 IR guided chest tube placed--1 L out ?3/27 DNase/tPA first dose--800 cc out ?3/28 cardiovascular surgery consulted 2/27X 7 cm RUL masslike partial cavitary lesion-recommending conservative management ?3/29 pleural fibrinolysis performed again with dornase  ? ? ?Hospital-Problem based course ? ?Necrotizing cavitary pneumonia-low suspicion per PCCM for TB ?Legionella, strep pneumo, respiratory viral pathogen panel negative ?Antibiotics-->Unasyn-expectant management will need at least 3 to 4 weeks of antibiotics-defer duration to specialists ?Last CXR 3/20 9 AM R hydropneumothorax with interval decrease in fluid component ?Discussion with patient family Dr. Cliffton Asters CVTS 3/30 and no role for operative management given high risks and low benefit ?Defer chest tube management to CVTS and pulmonary-status post 3 episodes of pleural fibrinolysis ?Continue Tussionex 5 mill every 12, Robitussin every 4 as needed pleuritic pain ?Pleuritic chest pain ?Pain is improved on scheduled Naprosyn 500 3 times daily  ?Continue oxycodone 5 every 4 as needed, Dilaudid 1 mg every 4 as needed severe pain ??  Cavitary lesion risk for TB ?QuantiFERON indeterminant ?AFB sputum pending ?Low risk for Dr. 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 ?Periodic supplements ? ?DVT prophylaxis: SCD ?Code Status: Full code presumed ?Family Communication: Discussed with patient's mother at bedside as well as ? ?Ottie Glazier Spouse [phone]  ? ?Disposition:  ?Status is: Inpatient ?Remains inpatient appropriate because: Continued medical work-up and on IV therapies at this time ?  ?Consultants:  ?Critical care medicine ?Interventional radiology ? ?Procedures: As above ? ?Antimicrobials: As above ? ? ?Subjective:-Interviewed with telemetry interpreter  ? ?Doing fair-fever curve seems to have abated since 3/28 ?Interviewed with interpreter ?Pain is much better controlled states is 0 ?Asks about plan for chest tube, further management-see above ? ?Objective: ?Vitals:  ? 09/20/21 2329 09/21/21 0736 09/21/21 1634 09/21/21 1956  ?BP: 111/85 108/64 114/72 122/76  ?Pulse: 91 74 82 81  ?Resp: 18 18 18 20   ?Temp: (!) 101.2 ?F (38.4 ?C) 99.7 ?F (37.6 ?C) 98.2 ?F (36.8 ?C) 98.2 ?F (36.8 ?C)  ?TempSrc: Oral Oral Oral Oral  ?SpO2: 97% 97% 97% 98%  ?Weight:      ?Height:      ? ? ?Intake/Output Summary (Last 24 hours) at 09/22/2021 1757 ?Last data filed at 09/22/2021 1143 ?Gross per 24 hour  ?Intake 380 ml  ?Output 90 ml  ?Net 290 ml  ? ? ?Filed Weights  ? 09/14/21 0305  ?Weight: 78.9 kg  ? ? ?Examination: ? ?EOMI NCAT moderately built ?Decreased air entry right posterior lateral lung fields cannot appreciate egophony ?S1-S2 slight tachycardia ?Abdomen soft no rebound ?No lower extremity edema ?Much more pleasant today conversant ? ? ? ?Data Reviewed: personally reviewed  ? ?CBC ?   ?Component Value Date/Time  ? WBC 3.2 (L) 09/22/2021 0218  ? RBC 3.96 (L) 09/22/2021  [id number]  ? HGB 10.7 (L) 09/22/2021 0218  ? HCT 33.1 (L) 09/22/2021 0218  ? PLT 152 09/22/2021 0218  ? MCV 83.6 09/22/2021 0218  ? MCH 27.0 09/22/2021 0218  ? MCHC 32.3  09/22/2021 0218  ? RDW 15.3 09/22/2021 0218  ? LYMPHSABS 0.7 09/22/2021 0218  ? MONOABS 0.2 09/22/2021 0218  ? EOSABS 0.0 09/22/2021 0218  ? BASOSABS 0.0 09/22/2021 0218  ? ? ?  Latest Ref Rng & Units 09/22/2021  ?  2:18 AM 09/21/2021  ?  2:43 AM 09/18/2021  ?  3:39 AM  ?CMP  ?Glucose 70 - 99 mg/dL 191165   [id number]   [id number]    ?BUN 6 - 20 mg/dL 6   5   6     ?Creatinine 0.61 - 1.24 mg/dL 6.210.90   3.080.97   6.570.94    ?Sodium 135 - 145 mmol/L 134   131   134    ?Potassium 3.5 - 5.1 mmol/L 3.8   4.9   3.8    ?Chloride 98 - 111 mmol/L 102   100   103    ?CO2 22 - 32 mmol/L 25   22   20     ?Calcium 8.9 - 10.3 mg/dL 8.0   8.3   8.0    ?Total Protein 6.5 - 8.1 g/dL 5.8      ?Total Bilirubin 0.3 - 1.2 mg/dL 0.5      ?Alkaline Phos 38 - 126 U/L 66      ?AST 15 - 41 U/L 79      ?ALT 0 - 44 U/L 88      ? ? ? ?Radiology Studies: ?DG CHEST PORT 1 VIEW ? ?Result Date: 09/22/2021 ?CLINICAL DATA:  Multifocal pneumonia. Chest pain. Abdominal pain. Chest tube in place. EXAM: PORTABLE CHEST 1 VIEW COMPARISON:  AP chest 09/21/2021, CT chest 09/19/2021 FINDINGS: There is again a small caliber chest tube overlying the right lung base. There is again elevation of the right hemidiaphragm there is opacification of the right lung base corresponding to the loculated pleural fluid seen on prior CT. Lateral mid to upper right lung opacity is unchanged from 09/21/2021 radiographs, corresponding to the consolidation with air bronchograms and potential cavitary lesions on prior CT. Tiny left pleural effusion. Cardiac silhouette and mediastinal contours are within normal limits. No definite pneumothorax is identified. No acute skeletal abnormality. IMPRESSION: The questioned tiny right apical pneumothorax on 09/21/2021 frontal radiograph is not as well visualized on the current study. Otherwise, no significant change from 09/21/2021 radiographs. Right basilar chest tube with loculated right basilar pleural effusion. Lateral mid to upper right lung consolidation  corresponding to the area concerning for cavitary infection on prior CT. Electronically Signed   By: Neita Garnetonald  Viola M.D.   On: 09/22/2021 08:49  ? ?DG CHEST PORT 1 VIEW ? ?Result Date: 09/21/2021 ?CLINICAL DATA:  Loculated pleural effusion. EXAM: PORTABLE CHEST 1 VIEW COMPARISON:  Chest x-ray dated September 21, 2021 FINDINGS: Cardiac and mediastinal contours are unchanged. Slightly decreased size of loculated right pleural effusion. Unchanged elevation of the right hemidiaphragm. Shifting of air with interval resolution of right basilar pneumothorax and new trace right apical pneumothorax. IMPRESSION: Right hydropneumothorax with interval decreased size of fluid component and small associated right apical pneumothorax. Chest tube in place. Electronically Signed   By: Allegra LaiLeah  Strickland M.D.   On: 09/21/2021 08:33   ? ? ?Scheduled Meds: ? chlorpheniramine-HYDROcodone  5 mL Oral Q12H  ? naproxen  500 mg Oral TID WC  ?  sodium chloride flush  10 mL Other Q8H  ? sodium chloride flush  10 mL Other Q8H  ? sodium chloride flush  10 mL Other Q8H  ? ?Continuous Infusions: ? ampicillin-sulbactam (UNASYN) IV 3 g (09/22/21 1535)  ? ? ? LOS: 8 days  ? ?Time spent: 35 ? ?Rhetta Mura, MD ?[facility] ?To contact the attending provider between 7A-7P or the covering provider during after hours 7P-7A, please log into the web site [email or web address] and access using universal [place] password for that web site. If you do not have the password, please call the hospital operator. ? ?09/22/2021, 5:57 PM  ? ? ?

## 2021-09-22 NOTE — Plan of Care (Signed)

## 2021-09-23 ENCOUNTER — Telehealth: Payer: Self-pay | Admitting: Pulmonary Disease

## 2021-09-23 ENCOUNTER — Inpatient Hospital Stay (HOSPITAL_COMMUNITY): Payer: BC Managed Care – PPO

## 2021-09-23 LAB — CBC WITH DIFFERENTIAL/PLATELET
Abs Immature Granulocytes: 0.04 10*3/uL (ref 0.00–0.07)
Basophils Absolute: 0 10*3/uL (ref 0.0–0.1)
Basophils Relative: 0 %
Eosinophils Absolute: 0.1 10*3/uL (ref 0.0–0.5)
Eosinophils Relative: 2 %
HCT: 33.3 % — ABNORMAL LOW (ref 39.0–52.0)
Hemoglobin: 10.8 g/dL — ABNORMAL LOW (ref 13.0–17.0)
Immature Granulocytes: 2 %
Lymphocytes Relative: 29 %
Lymphs Abs: 0.8 10*3/uL (ref 0.7–4.0)
MCH: 27.4 pg (ref 26.0–34.0)
MCHC: 32.4 g/dL (ref 30.0–36.0)
MCV: 84.5 fL (ref 80.0–100.0)
Monocytes Absolute: 0.2 10*3/uL (ref 0.1–1.0)
Monocytes Relative: 7 %
Neutro Abs: 1.6 10*3/uL — ABNORMAL LOW (ref 1.7–7.7)
Neutrophils Relative %: 60 %
Platelets: 178 10*3/uL (ref 150–400)
RBC: 3.94 MIL/uL — ABNORMAL LOW (ref 4.22–5.81)
RDW: 15.4 % (ref 11.5–15.5)
WBC: 2.6 10*3/uL — ABNORMAL LOW (ref 4.0–10.5)
nRBC: 0 % (ref 0.0–0.2)

## 2021-09-23 LAB — COMPREHENSIVE METABOLIC PANEL
ALT: 88 U/L — ABNORMAL HIGH (ref 0–44)
AST: 68 U/L — ABNORMAL HIGH (ref 15–41)
Albumin: 2.1 g/dL — ABNORMAL LOW (ref 3.5–5.0)
Alkaline Phosphatase: 75 U/L (ref 38–126)
Anion gap: 5 (ref 5–15)
BUN: <5 mg/dL — ABNORMAL LOW (ref 6–20)
CO2: 24 mmol/L (ref 22–32)
Calcium: 8.3 mg/dL — ABNORMAL LOW (ref 8.9–10.3)
Chloride: 105 mmol/L (ref 98–111)
Creatinine, Ser: 0.96 mg/dL (ref 0.61–1.24)
GFR, Estimated: >60 mL/min (ref >60)
Glucose, Bld: 98 mg/dL (ref 70–99)
Potassium: 4.7 mmol/L (ref 3.5–5.1)
Sodium: 134 mmol/L — ABNORMAL LOW (ref 135–145)
Total Bilirubin: 0.5 mg/dL (ref 0.3–1.2)
Total Protein: 6.2 g/dL — ABNORMAL LOW (ref 6.5–8.1)

## 2021-09-23 MED ORDER — AMOXICILLIN-POT CLAVULANATE 875-125 MG PO TABS
1.0000 | ORAL_TABLET | Freq: Two times a day (BID) | ORAL | Status: DC
  Administered 2021-09-23 – 2021-09-24 (×3): 1 via ORAL
  Filled 2021-09-23 (×3): qty 1

## 2021-09-23 NOTE — Progress Notes (Signed)
? ?  NAME:  Calvin Moore, MRN:  [id number], DOB:  11-Jan-1996, LOS: 9 ?ADMISSION DATE:  09/13/2021, CONSULTATION DATE:  09/16/21 ?REFERRING MD:  Dr. Renford Dills, CHIEF COMPLAINT:  09/16/21  ? ?History of Present Illness:  ?26 year old male with prior hx of heavy ETOH abuse who presented on 3/21 with two day history of progressively worse right sided chest pain and RUQ abdominal pain with associated shortness of breath.  CTA PE neg but showed multifocal pneumonia with small partially loculated right pleural effusion.  Additionally found to be thrombocytopenic with splenomegaly and suspected cirrhosis. Admitted to [facility] and started on azithromycin and ceftriaxone.  Patient continued to be febrile, therefore CT chest was rechecked which showed worsening multifocal pneumonia, right worse than left, and suggestive of pulmonary necrosis in right upper lobe with increased loculated pleural effusion, moderate size now.  He remains on room air.  Pulmonary consulted for further recommendations.  ? ?Pertinent  Medical History  ?ETOH abuse  ? ?Significant Hospital Events: ?Including procedures, antibiotic start and stop dates in addition to other pertinent events   ?3/21 admitted [facility] w/ multifocal pna, small partially loculated R effusion, thrombocytopenia, splenomegaly on azithro/ ceftriaxone. COVID, flu, RVP negative ?3/24 still febrile, repeat CT chest > worsening multifocal pna R>L with concern for RUL abscess, mod loculated R effusion.  Transitioned to Unasyn.  PCCM consulted.  ?3/25 IR CT guided chest tube placed  ?3/27 DNase/tPA first dose ?3/29 3 rd dose TPA ?09/23/2021 no drainage from chest tube no air leak on waterseal persistent small right pneumothorax. ? ?Interim History / Subjective:  ? ?190 cc x 24 hrs. Cxr pending ? ?Objective   ?Blood pressure 132/85, pulse 73, temperature 98 ?F (36.7 ?C), temperature source Oral, resp. rate 16, height 5\' 7"  (1.702 m), weight 78.9 kg, SpO2 100 %. ?   ?   ? ?Intake/Output Summary (Last  24 hours) at 09/23/2021 0840 ?Last data filed at 09/23/2021 0600 ?Gross per 24 hour  ?Intake 340 ml  ?Output 0 ml  ?Net 340 ml  ? ?Filed Weights  ? 09/14/21 0305  ?Weight: 78.9 kg  ? ? ?General: Well-nourished well-developed male no acute distress reports breathing better ?HEENT: MM pink/moist ?Neuro: Grossly intact without focal defect ?CV: Heart sounds are regular regular rate rhythm ?PULM: Right chest tube without air leak and with no drainage breath sounds, on room air with sats of 100% ? ? ?GI: soft, bsx4 active  ?GU: Voids ?Extremities: warm/dry,  edema  ?Skin: no rashes or lesions ? ? ? ? ?Resolved Hospital Problem list   ? ? ?Assessment & Plan:  ? ?Multifocal Pneumonia right-sided pneumonia involving most of the right upper lobe, also the right lower lobe.  CT imaging consistent with early abscess formation concerning for possible anaerobic infection.   ?Certainly possible and patient with a history of heavy alcohol use. ? ?Continue antimicrobial therapy ?No positive culture data ? ? ? ? ?Loculated right pleural effusion consistent with a complicated pleural space, likely parapneumonic versus empyema.  He has continued fever and is a high risk for empyema. ? ?Recommendations: ?Continue IV antibiotic ?Consider DC chest tube with drainage no air leak with small right pneumothorax persist ?Outpatient follow-up ?CVTS is following ? ?Critical care time: n/a  ? ?09/16/21 Revel Stellmach ACNP ?Acute Care Nurse Practitioner ?[facility] ?Please consult Amion ?09/23/2021, 8:40 AM ? ? ? ? ? ? ?

## 2021-09-23 NOTE — Progress Notes (Signed)
? ? ?Referring Physician(s): Canary Brim NP ? ?Supervising Physician: Irish Lack ? ?Patient Status:  [facility] - In-pt ? ?Chief Complaint: Right pleural effusion; loculated. Right 14 Fr pigtail chest tube placed in IR 09/17/21 by Dr. Bryn Gulling. Small right pneumothorax identified on interval imaging 09/19/21 ? ?Subjective: Patient in bed, awake/alert. No discomfort or distress observed. Multiple male visitors at the bedside. Right chest tube in place.  ? ?Allergies: ?Patient has no known allergies. ? ?Medications: ?Prior to Admission medications   ?Not on File  ? ? ? ?Vital Signs: ?BP 132/85 (BP Location: Left Arm)   Pulse 73   Temp 98 ?F (36.7 ?C) (Oral)   Resp 16   Ht 5\' 7"  (1.702 m)   Wt 173 lb 15.1 oz (78.9 kg)   SpO2 100%   BMI 27.24 kg/m?  ? ?Physical Exam ?Constitutional:   ?   General: He is not in acute distress. ?   Appearance: He is not ill-appearing.  ?Pulmonary:  ?   Effort: Pulmonary effort is normal.  ?   Comments: Chest tube in the right upper chest - anterior axillary line. Insertion site is unremarkable. ?Skin: ?   General: Skin is warm and dry.  ?Neurological:  ?   Mental Status: He is alert.  ? ? ?Imaging: ?DG CHEST PORT 1 VIEW ? ?Result Date: 09/23/2021 ?CLINICAL DATA:  Pleural effusion. EXAM: PORTABLE CHEST 1 VIEW COMPARISON:  September 22, 2021. FINDINGS: Stable cardiomegaly. Stable position of right-sided pleural drainage catheter. Probable minimal right apical pneumothorax is noted. Stable right upper lobe opacity is noted concerning for pneumonia. Minimal left pleural effusion is noted. Bony thorax is unremarkable. IMPRESSION: Stable position of right-sided pleural drainage catheter. Minimal right apical pneumothorax is noted. Stable right upper lobe opacity is noted concerning for pneumonia. Electronically Signed   By: September 24, 2021 M.D.   On: 09/23/2021 08:17  ? ?DG CHEST PORT 1 VIEW ? ?Result Date: 09/22/2021 ?CLINICAL DATA:  Multifocal pneumonia. Chest pain. Abdominal pain. Chest tube in  place. EXAM: PORTABLE CHEST 1 VIEW COMPARISON:  AP chest 09/21/2021, CT chest 09/19/2021 FINDINGS: There is again a small caliber chest tube overlying the right lung base. There is again elevation of the right hemidiaphragm there is opacification of the right lung base corresponding to the loculated pleural fluid seen on prior CT. Lateral mid to upper right lung opacity is unchanged from 09/21/2021 radiographs, corresponding to the consolidation with air bronchograms and potential cavitary lesions on prior CT. Tiny left pleural effusion. Cardiac silhouette and mediastinal contours are within normal limits. No definite pneumothorax is identified. No acute skeletal abnormality. IMPRESSION: The questioned tiny right apical pneumothorax on 09/21/2021 frontal radiograph is not as well visualized on the current study. Otherwise, no significant change from 09/21/2021 radiographs. Right basilar chest tube with loculated right basilar pleural effusion. Lateral mid to upper right lung consolidation corresponding to the area concerning for cavitary infection on prior CT. Electronically Signed   By: 09/23/2021 M.D.   On: 09/22/2021 08:49  ? ?DG CHEST PORT 1 VIEW ? ?Result Date: 09/21/2021 ?CLINICAL DATA:  Loculated pleural effusion. EXAM: PORTABLE CHEST 1 VIEW COMPARISON:  Chest x-ray dated September 21, 2021 FINDINGS: Cardiac and mediastinal contours are unchanged. Slightly decreased size of loculated right pleural effusion. Unchanged elevation of the right hemidiaphragm. Shifting of air with interval resolution of right basilar pneumothorax and new trace right apical pneumothorax. IMPRESSION: Right hydropneumothorax with interval decreased size of fluid component and small associated right apical pneumothorax.  Chest tube in place. Electronically Signed   By: Allegra LaiLeah  Strickland M.D.   On: 09/21/2021 08:33  ? ?DG CHEST PORT 1 VIEW ? ?Result Date: 09/20/2021 ?CLINICAL DATA:  Provided history: Loculated pleural effusion. EXAM:  PORTABLE CHEST 1 VIEW COMPARISON:  CT chest 09/19/2021. Prior chest radiographs 09/18/2021 and earlier. FINDINGS: Unchanged position of a pleural pigtail catheter projecting at the right lung base. Similar appearance of a large loculated right pleural effusion. Small pneumothorax component at the right lung base, similar to the chest CT performed yesterday. Superimposed opacity within the right upper lobe compatible with the dense, masslike partially cavitary lesion noted at this site on the prior chest CT. Additional opacity at the right lung base likely reflecting the compressive atelectasis noted on yesterday's chest CT. Persistent small left pleural effusion. IMPRESSION: Right pleural pigtail catheter projecting at the right lung base, unchanged. Similar appearance of a large loculated right pleural effusion. Small pneumothorax component at the right lung base, similar to the chest CT performed yesterday. Superimposed opacity within the right upper lobe compatible with the dense, masslike partially cavitary lesion noted at this site on the prior chest CT. Please refer to this prior report for further description. Additional opacity at the right lung base, likely reflecting compressive atelectasis. Persistent small left pleural effusion. Electronically Signed   By: Jackey LogeKyle  Golden D.O.   On: 09/20/2021 08:25   ? ?Labs: ? ?CBC: ?Recent Labs  ?  09/18/21 ?0339 09/21/21 ?0243 09/22/21 ?0218 09/23/21 ?0322  ?WBC 4.0 6.1 3.2* 2.6*  ?HGB 11.0* 11.2* 10.7* 10.8*  ?HCT 32.8* 34.3* 33.1* 33.3*  ?PLT 74* 171 152 178  ? ? ?COAGS: ?Recent Labs  ?  09/14/21 ?0503 09/17/21 ?1005 09/22/21 ?0218  ?INR 1.5* 1.3* 1.4*  ?APTT 35  --   --   ? ? ?BMP: ?Recent Labs  ?  09/18/21 ?0339 09/21/21 ?0243 09/22/21 ?0218 09/23/21 ?0322  ?NA 134* 131* 134* 134*  ?K 3.8 4.9 3.8 4.7  ?CL 103 100 102 105  ?CO2 20* 22 25 24   ?GLUCOSE 102* 106* 165* 98  ?BUN 6 5* 6 <5*  ?CALCIUM 8.0* 8.3* 8.0* 8.3*  ?CREATININE 0.94 0.97 0.90 0.96  ?GFRNONAA >60 >60  >60 >60  ? ? ?LIVER FUNCTION TESTS: ?Recent Labs  ?  09/13/21 ?1647 09/17/21 ?1005 09/22/21 ?0218 09/23/21 ?0322  ?BILITOT 1.4*  --  0.5 0.5  ?AST 26  --  79* 68*  ?ALT 34  --  88* 88*  ?ALKPHOS 39  --  66 75  ?PROT 7.4 5.9* 5.8* 6.2*  ?ALBUMIN 3.5  --  2.0* 2.1*  ? ? ?Assessment and Plan: ? ?Right pleural effusion; loculated. Right 14 Fr pigtail chest tube placed in IR 09/17/21 by Dr. Bryn GullingMir. Small right pneumothorax identified on interval imaging 09/19/21 ? ?DG Chest 09/23/21 0410 ?IMPRESSION: ?Stable position of right-sided pleural drainage catheter. Minimal ?right apical pneumothorax is noted. Stable right upper lobe opacity ?is noted concerning for pneumonia. ? ?tPA/DNase instillation x 3 by Critical Care. No drainage from the chest tube in the past 24 hours. Interventional Radiology requested to remove chest tube.  ? ?Right pigtail chest tube removed at the bedside. Suture cut, catheter clamped and cut, tube removed intact. Vaseline gauze/gauze/tegaderm placed over the site. Patient tolerated removal well with minor discomfort in the right upper chest following removal. Post-removal STAT chest x-ray ordered.  ? ?Ok to remove dressing tomorrow. Further plans per primary teams.  ? ?Please call IR if we can be of further  assistance.  ? ?Electronically Signed: ?Alwyn Ren, AGACNP-BC ?[phone] ?09/23/2021, 11:00 AM ? ? ?I spent a total of 15 Minutes at the the patient's bedside AND on the patient's hospital floor or unit, greater than 50% of which was counseling/coordinating care for right chest tube.  ? ? ? ? ? ?

## 2021-09-23 NOTE — Telephone Encounter (Signed)
Schedule patient for hospital follow-up with Dr. Vaughan Browner in 3-4 weeks for follow-up.  ?

## 2021-09-23 NOTE — Plan of Care (Signed)

## 2021-09-23 NOTE — Progress Notes (Signed)
?PROGRESS NOTE ? ? ?Calvin Moore  [id number] DOB: 22-Sep-1995 DOA: 09/13/2021 ?PCP: Patient, No Pcp Per (Inactive)  ?Brief Narrative:  ?26 year old Swahili male ?Prior heavy EtOH use ?Presented 09/13/2021 significant chest pain SOB TCP splenomegaly?  Cirrhosis ?3/24-still febrile repeat CT = worsening multifocal pneumonia + RUL abscess moderate loculated-CCM consulted ?3/25 IR guided chest tube placed--1 L out ?3/27 DNase/tPA first dose--800 cc out ?3/28 cardiovascular surgery consulted 2/27X 7 cm RUL masslike partial cavitary lesion-recommending conservative management ?3/29 pleural fibrinolysis performed again with dornase  ?3/31: Pain better controlled over the past several days not really using IV morphine, 14 French pigtail catheter removed ? ? ?Hospital-Problem based course ? ?Necrotizing cavitary pneumonia-low suspicion per PCCM for TB ?Legionella, strep pneumo, respiratory viral pathogen panel negative ?Antibiotics-->Unasyn--->Augmentin on discharge ?Last CXR 3/20 9 AM R hydropneumothorax with interval decrease in fluid component ?Discussion with patient family Dr. Cliffton Asters CVTS 3/30 and no role for operative management given high risks and low benefit ?Pigtail chest tube removed 3/31 antibiotics adjusted as above stop date ?Continue Tussionex 5 mill every 12, Robitussin every 4 as needed pleuritic pain ?Pleuritic chest pain ?Pain is improved on scheduled Naprosyn 500 back to twice daily ?Continue oxycodone 5 every 4 as needed, Dilaudid discontinued ??  Cavitary lesion risk for TB ?QuantiFERON indeterminant ?AFB sputum pending ?Low risk for Dr. Ilsa Iha ID for TB but moved here from [place] 7 years ago ?Repeated QuantiFERON on 3/30-will be followed in the outpatient setting by Dr. 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 ?DVT prophylaxis: SCD ?Code Status: Full code presumed ?Family Communication: Discussed with patient's mother at bedside as well as ? ?Ottie Glazier Spouse [phone]  ? ?Disposition:  ?Status is: Inpatient ?Remains inpatient appropriate because: Continued medical work-up and on IV therapies at this time ?  ?Consultants:  ?Critical care medicine ?Interventional radiology ?Cardiovascular thoracic surgery ? ?Procedures: As above ? ?Antimicrobials: As above ? ? ?Subjective:-Interviewed with telemetry interpreter  ? ?Looks much better eating drinking happy ?Chest pain 0-1/10 no fever no chills ? ?Objective: ?Vitals:  ? 09/21/21 1956 09/23/21 0015 09/23/21 0740 09/23/21 1559  ?BP: 122/76 117/72 132/85 117/63  ?Pulse: 81 67 73 67  ?Resp: 20 16 16 16   ?Temp: 98.2 ?F (36.8 ?C) 98.4 ?F (36.9 ?C) 98 ?F (36.7 ?C) 98 ?F (36.7 ?C)  ?TempSrc: Oral Oral Oral Oral  ?SpO2: 98% 100% 100% 99%  ?Weight:      ?Height:      ? ? ?Intake/Output Summary (Last 24 hours) at 09/23/2021 1649 ?Last data filed at 09/23/2021 0600 ?Gross per 24 hour  ?Intake 330 ml  ?Output 0 ml  ?Net 330 ml  ? ? ?Filed Weights  ? 09/14/21 0305  ?Weight: 78.9 kg  ? ? ?Examination: ? ?EOMI NCAT moderately built ?Decreased air entry right posterior lateral lung fields ?S1-S2 slight tachycardia ?Abdomen soft no rebound ? ? ? ? ?Data Reviewed: personally reviewed  ? ?CBC ?   ?Component Value Date/Time  ? WBC 2.6 (L) 09/23/2021 0322  ? RBC 3.94 (L) 09/23/2021 0322  ? HGB 10.8 (L) 09/23/2021 0322  ? HCT 33.3 (L) 09/23/2021 0322  ? PLT 178 09/23/2021 0322  ? MCV  84.5 09/23/2021 0322  ? MCH 27.4 09/23/2021 0322  ? MCHC 32.4 09/23/2021 0322  ? RDW 15.4 09/23/2021 0322  ? LYMPHSABS 0.8 09/23/2021 0322  ? MONOABS 0.2 09/23/2021 0322  ? EOSABS 0.1 09/23/2021 0322  ?  BASOSABS 0.0 09/23/2021 0322  ? ? ?  Latest Ref Rng & Units 09/23/2021  ?  3:22 AM 09/22/2021  ?  2:18 AM 09/21/2021  ?  2:43 AM  ?CMP  ?Glucose 70 - 99 mg/dL 98   093   267    ?BUN 6 - 20 mg/dL 5   6   5     ?Creatinine 0.61 - 1.24 mg/dL 1.24   5.80   9.98    ?Sodium 135 - 145 mmol/L 134   134   131    ?Potassium 3.5 - 5.1 mmol/L 4.7   3.8   4.9    ?Chloride 98 - 111 mmol/L 105   102   100    ?CO2 22 - 32 mmol/L 24   25   22     ?Calcium 8.9 - 10.3 mg/dL 8.3   8.0   8.3    ?Total Protein 6.5 - 8.1 g/dL 6.2   5.8     ?Total Bilirubin 0.3 - 1.2 mg/dL 0.5   0.5     ?Alkaline Phos 38 - 126 U/L 75   66     ?AST 15 - 41 U/L 68   79     ?ALT 0 - 44 U/L 88   88     ? ? ? ?Radiology Studies: ?DG Chest Port 1 View ? ?Result Date: 09/23/2021 ?CLINICAL DATA:  Right chest tube removed. EXAM: PORTABLE CHEST 1 VIEW COMPARISON:  Radiograph dated September 23, 2021 at 6:16 a.m. FINDINGS: The heart size and mediastinal contours are within normal limits. Stable right upper lobe hazy opacity. Small right apical pneumothorax is unchanged. Interval removal of the right-sided chest tube. No acute osseous abnormality. IMPRESSION: 1. Interval removal of the chest tube, stable small right apical pneumothorax. 2. Right upper lobe hazy opacity concerning for airspace disease, unchanged. Electronically Signed   By: September 25, 2021 D.O.   On: 09/23/2021 11:45  ? ?DG CHEST PORT 1 VIEW ? ?Result Date: 09/23/2021 ?CLINICAL DATA:  Pleural effusion. EXAM: PORTABLE CHEST 1 VIEW COMPARISON:  September 22, 2021. FINDINGS: Stable cardiomegaly. Stable position of right-sided pleural drainage catheter. Probable minimal right apical pneumothorax is noted. Stable right upper lobe opacity is noted concerning for pneumonia. Minimal left pleural effusion is noted. Bony thorax is unremarkable. IMPRESSION: Stable position of right-sided pleural drainage catheter. Minimal right apical pneumothorax is noted. Stable right upper lobe opacity is noted concerning for pneumonia.  Electronically Signed   By: September 24, 2021 M.D.   On: 09/23/2021 08:17  ? ?DG CHEST PORT 1 VIEW ? ?Result Date: 09/22/2021 ?CLINICAL DATA:  Multifocal pneumonia. Chest pain. Abdominal pain. Chest tube in place. EXAM: PORTABLE CHEST 1 VIEW COMPARISON:  AP chest 09/21/2021, CT chest 09/19/2021 FINDINGS: There is again a small caliber chest tube overlying the right lung base. There is again elevation of the right hemidiaphragm there is opacification of the right lung base corresponding to the loculated pleural fluid seen on prior CT. Lateral mid to upper right lung opacity is unchanged from 09/21/2021 radiographs, corresponding to the consolidation with air bronchograms and potential cavitary lesions on prior CT. Tiny left pleural effusion. Cardiac silhouette and mediastinal contours are within normal limits. No definite pneumothorax is identified. No acute skeletal  abnormality. IMPRESSION: The questioned tiny right apical pneumothorax on 09/21/2021 frontal radiograph is not as well visualized on the current study. Otherwise, no significant change from 09/21/2021 radiographs. Right basilar chest tube with loculated right basilar pleural effusion. Lateral mid to upper right lung consolidation corresponding to the area concerning for cavitary infection on prior CT. Electronically Signed   By: Neita Garnetonald  Viola M.D.   On: 09/22/2021 08:49   ? ? ?Scheduled Meds: ? amoxicillin-clavulanate  1 tablet Oral Q12H  ? chlorpheniramine-HYDROcodone  5 mL Oral Q12H  ? naproxen  500 mg Oral TID WC  ? sodium chloride flush  10 mL Other Q8H  ? sodium chloride flush  10 mL Other Q8H  ? sodium chloride flush  10 mL Other Q8H  ? ?Continuous Infusions: ? ? ? ? LOS: 9 days  ? ?Time spent: 35 ? ?Rhetta MuraJai-Gurmukh Nikira Kushnir, MD ?[facility] ?To contact the attending provider between 7A-7P or the covering provider during after hours 7P-7A, please log into the web site [email or web address] and access using universal [place] password for that web site.  If you do not have the password, please call the hospital operator. ? ?09/23/2021, 4:49 PM  ? ? ?

## 2021-09-24 ENCOUNTER — Inpatient Hospital Stay (HOSPITAL_COMMUNITY): Payer: BC Managed Care – PPO

## 2021-09-24 ENCOUNTER — Encounter: Payer: Self-pay | Admitting: Family Medicine

## 2021-09-24 MED ORDER — AMOXICILLIN-POT CLAVULANATE 875-125 MG PO TABS: 1.0000 | ORAL_TABLET | Freq: Two times a day (BID) | ORAL | 0 refills | Status: AC

## 2021-09-24 MED ORDER — OXYCODONE HCL 5 MG PO TABS: 5.0000 mg | ORAL_TABLET | ORAL | 0 refills | Status: AC | PRN

## 2021-09-24 MED ORDER — OXYCODONE HCL 5 MG PO TABS: 5.0000 mg | ORAL_TABLET | ORAL | 0 refills | Status: DC | PRN

## 2021-09-24 MED ORDER — HYDROCOD POLI-CHLORPHE POLI ER 10-8 MG/5ML PO SUER: 5.0000 mL | Freq: Two times a day (BID) | ORAL | 0 refills | Status: AC

## 2021-09-24 MED ORDER — NAPROXEN 500 MG PO TABS: 500.0000 mg | ORAL_TABLET | Freq: Two times a day (BID) | ORAL | 0 refills | Status: DC

## 2021-09-24 NOTE — Discharge Summary (Signed)
. ?Physician Discharge Summary ?  ?Patient: Calvin Moore MRN: [id number] DOB: [birth year]  ?Admit date:     09/13/2021  ?Discharge date: 09/24/21  ?Discharge Physician: Nita Sells  ? ?PCP: Patient, No Pcp Per (Inactive)  ? ?Recommendations at discharge:  ? ?Patient will need outpatient follow-up of [name] by Dr. Jerilynn Mages. Singh of [facility] I will CC her on this note ?Patient will need follow-up with pulmonology for necrotic lung mass and this will be coordinated by Dr. Monico Blitz ?Complete a total of 1 month Augmentin-Rx given ? ?Discharge Diagnoses: ?Principal Problem: ?  Multifocal pneumonia ?Active Problems: ?  Pericardial effusion ?  Splenomegaly ?  Thrombocytopenia ([place]) ?  Alcohol use ?  Hospital acquired PNA ?  Hypokalemia ?  Pleural effusion ? ?Resolved Problems: ?  * No resolved hospital problems. * ? ?Hospital Course: ?26 year old Swahili male ?Prior heavy EtOH use ?Presented 09/13/2021 significant chest pain SOB TCP splenomegaly?  Cirrhosis ?3/24-still febrile repeat CT = worsening multifocal pneumonia + RUL abscess moderate loculated-CCM consulted ?3/25 IR guided chest tube placed--1 L out ?3/27 DNase/tPA first dose--800 cc out ?3/28 cardiovascular surgery consulted 2/27X 7 cm RUL masslike partial cavitary lesion-recommending conservative management ?3/29 pleural fibrinolysis performed again with dornase  ?3/31: Pain better controlled over the past several days not really using IV morphine, 14 French pigtail catheter removed ?  ?  ?Hospital-Problem based course ?  ?Necrotizing cavitary pneumonia-low suspicion per PCCM for TB ?Legionella, strep pneumo, respiratory viral pathogen panel negative ?Antibiotics-->Unasyn--->Augmentin on discharge which patient will complete a total of 1 month of therapy ?Last CXR 3/29 AM R hydropneumothorax with interval decrease in fluid component ?Discussion with patient family Dr. Kipp Brood CVTS 3/30 and no role for operative management given high risks and low  benefit from procedure and this will probably need outpatient reimaging although would not change management ?Pigtail chest tube removed 3/31 antibiotics adjusted as above stop date ?Prescription was given for antitussives ?Pain was controlled with predominantly Naprosyn ?Pleuritic chest pain ?Pain is improved on scheduled Naprosyn 500 back to twice daily ?Opiates and Dilaudid discontinued prior to discharge ??  Cavitary lesion risk for TB ?QuantiFERON 09/17/2021 was indeterminant--AFB sputum culture 09/17/2021 is pending ?Low risk for Dr. Graylon Good ID for TB but moved here from [place] 7 years ago ?Repeated QuantiFERON on 3/30-will be followed in the outpatient setting by Dr. 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  ? ? ?  ? ? ?Consultants: Pulmonology, cardiovascular surgery, interventional radiology ?Discussed case with infectious disease briefly ?Procedures performed: Pigtail catheter placement under IR 3/25 status post multiple DNase installations ?Disposition: Home ?Diet recommendation:  ?Discharge Diet Orders (From admission, onward)  ? ?  Start     Ordered  ? 09/24/21 0000  Diet - low sodium heart healthy       ? 09/24/21 0855  ? ?  ?  ? ?  ? ?Regular diet ?DISCHARGE MEDICATION: ?Allergies as of 09/24/2021   ?No Known Allergies ?  ? ?  ?Medication List  ?  ? ?TAKE these medications   ? ?amoxicillin-clavulanate 875-125 MG tablet ?Commonly known as: AUGMENTIN ?Take 1 tablet by mouth every 12 (twelve) hours for 17 days. ?  ?chlorpheniramine-HYDROcodone 10-8 MG/5ML ?  Take 5 mLs by mouth every 12 (twelve) hours. ?  ?naproxen 500 MG tablet ?Commonly known  as: NAPROSYN ?Take 1 tablet (500 mg total) by mouth 2 (two) times daily with a meal. ?  ?oxyCODONE 5 MG immediate release tablet ?Commonly known as: Oxy IR/ROXICODONE ?Take 1 tablet (5 mg total) by mouth every 4 (four) hours as needed for breakthrough pain or severe pain. ?  ? ?  ? ? Follow-up Information   ? ? [facility]. Call on 10/13/2021.   ?Why: Post hospital follow up scheduled for 10/13/2021 at 2:10 pm with Freeman Caldron PA  and  to establish care ?Contact information: ?[address] ?[place] [phone] ?[phone] ? ?  ?  ? ? [place]. Go to .   ?Specialty: Emergency Medicine ?Why: If symptoms worsen ?Contact information: ?[address] ?[id number] mc ?[place] ?[phone] ? ?  ?  ? ?  ?  ? ?  ? ?Discharge Exam: ?Filed Weights  ? 09/14/21 0305  ?Weight: 78.9 kg  ? ?Awake coherent no distress ?No fever no chills overnight ?Appetite is improved pain is better controlled he states that about 0-2 out of 10 ?He has less sputum ? ? ?EOMI NCAT well-built male no distress ?CTA B decreased air entry right side posterolaterally no rales rhonchi ?ROM intact ?S1-S2 no murmur ?Abdomen soft no rebound no guarding ?Neurologically intact no rash  ? ?Condition at discharge: fair ? ?The results of significant diagnostics from this hospitalization (including imaging, microbiology, ancillary and laboratory) are listed below for reference.  ? ?Imaging Studies: ?DG Chest 2 View ? ?Result Date: 09/13/2021 ?CLINICAL DATA:  Chest pain. EXAM: CHEST - 2 VIEW COMPARISON:  None. FINDINGS: The heart size and mediastinal contours are within normal limits. Right upper lobe airspace opacity is noted consistent with pneumonia or atelectasis. Left lung is clear. The visualized skeletal structures are unremarkable. IMPRESSION: Right upper lobe airspace opacity is noted consistent with pneumonia or  atelectasis. Electronically Signed   By: Marijo Conception M.D.   On: 09/13/2021 10:54  ? ?CT CHEST WO CONTRAST ? ?Result Date: 09/19/2021 ?CLINICAL DATA:  Empyema, status post tube drainage EXAM: CT CHEST WITHOUT CONTRAST TECHNIQUE: Multidetector CT imaging of the chest was performed following the standard protocol without IV contrast. RADIATION DOSE REDUCTION: This exam was performed according to the departmental dose-optimization program which includes automated exposure control, adjustment of the mA and/or kV according to patient size and/or use of iterative reconstruction technique. COMPARISON:  09/16/2021 FINDINGS: Cardiovascular: No significant vascular findings. Normal heart size. No pericardial effusion. Mediastinum/Nodes: No enlarged mediastinal, hilar, or axillary lymph nodes. Thyroid gland, trachea, and esophagus demonstrate no significant findings. Lungs/Pleura: Redemonstrated large, loculated right pleural effusion. There has been interval placement of a pigtail drainage tube about the right lung base, with slight reduction in fluid volume and introduction of loculated pneumothorax components within the pleural effusion about the right lung base (series 3, image 100). Trace left pleural effusion, unchanged. Dense, masslike partially cavitary lesion of the posterior right upper lobe measuring 7.1 x 7.0 cm, not significantly changed (series 4, image 37). Upper Abdomen: No acute abnormality. Coarse, nodular cirrhotic morphology of the liver. Partially imaged splenomegaly. Musculoskeletal: No chest wall abnormality. No suspicious osseous lesions identified. IMPRESSION: 1. Redemonstrated large, loculated right pleural effusion. There has been interval placement of a pigtail drainage tube about the right lung base, with slight reduction in fluid  volume and introduction of loculated pneumothorax components within the pleural effusion about the right lung base. 2. Trace left pleural effusion, unchanged. 3. Dense,  masslike partially cavitary lesion of the posterior right upper lobe measuring 7.1 x 7.0 cm, not significantly changed and most consistent with cavitary infection. 4. Cirrhosis and partially imaged splenomegaly in

## 2021-09-24 NOTE — Progress Notes (Signed)
? ?  NAME:  Calvin Moore, MRN:  [id number], DOB:  12-28-1995, LOS: 10 ?ADMISSION DATE:  09/13/2021, CONSULTATION DATE:  09/16/21 ?REFERRING MD:  Dr. Renford Dills, CHIEF COMPLAINT:  09/16/21  ? ?History of Present Illness:  ?26 year old male with prior hx of heavy ETOH abuse who presented on 3/21 with two day history of progressively worse right sided chest pain and RUQ abdominal pain with associated shortness of breath.  CTA PE neg but showed multifocal pneumonia with small partially loculated right pleural effusion.  Additionally found to be thrombocytopenic with splenomegaly and suspected cirrhosis. Admitted to [facility] and started on azithromycin and ceftriaxone.  Patient continued to be febrile, therefore CT chest was rechecked which showed worsening multifocal pneumonia, right worse than left, and suggestive of pulmonary necrosis in right upper lobe with increased loculated pleural effusion, moderate size now.  He remains on room air.  Pulmonary consulted for further recommendations.  ? ?Pertinent  Medical History  ?ETOH abuse  ? ?Significant Hospital Events: ?Including procedures, antibiotic start and stop dates in addition to other pertinent events   ?3/21 admitted [facility] w/ multifocal pna, small partially loculated R effusion, thrombocytopenia, splenomegaly on azithro/ ceftriaxone. COVID, flu, RVP negative ?3/24 still febrile, repeat CT chest > worsening multifocal pna R>L with concern for RUL abscess, mod loculated R effusion.  Transitioned to Unasyn.  PCCM consulted.  ?3/25 IR CT guided chest tube placed  ?3/27 DNase/tPA first dose ?3/29 3 rd dose TPA ?09/23/2021 no drainage from chest tube no air leak on waterseal persistent small right pneumothorax. ? ?Interim History / Subjective:  ? ?190 cc x 24 hrs. Cxr pending ? ?Objective   ?Blood pressure 124/76, pulse 67, temperature 97.9 ?F (36.6 ?C), temperature source Oral, resp. rate 18, height 5\' 7"  (1.702 m), weight 78.9 kg, SpO2 98 %. ?   ?   ? ?Intake/Output Summary  (Last 24 hours) at 09/24/2021 0953 ?Last data filed at 09/23/2021 09/25/2021 ?Gross per 24 hour  ?Intake 580 ml  ?Output --  ?Net 580 ml  ? ?Filed Weights  ? 09/14/21 0305  ?Weight: 78.9 kg  ? ? ?General: Well-nourished well-developed male no acute distress reports breathing better ?HEENT: MM pink/moist ?Neuro: Grossly intact without focal defect ?CV: Heart sounds are regular regular rate rhythm ?PULM: Right chest tube without air leak and with no drainage breath sounds, on room air with sats of 100% ? ? ?GI: soft, bsx4 active  ?GU: Voids ?Extremities: warm/dry,  edema  ?Skin: no rashes or lesions ? ? ? ? ?Resolved Hospital Problem list   ? ? ?Assessment & Plan:  ? ?RUL cavitary pneumonia ?Right basilar loculated effusion  ?Small apical right pneumothorax ?  ?S/p tpa/dnase x 3 with unchanged effusion size.  ?Chest tube removed 3/31. Post procedure CXR with stable small apical PTX ?Discussed with CTS. No a surgical candidate with active necrotizing pneumonia ?CXR reviewed with stable PTX and unchanged effusion with right hemidiaphragm elevation ?Recommend Augmentin x 4 weeks ?Follow-up with Dr. 4/31 in 3-4 weeks ?  ?Updated patient and family at bedside ?Critical care time: n/a  ? ?Isaiah Serge, M.D. ?[facility] ?09/24/2021 9:53 AM  ? ?See Amion for personal pager ?For hours between 7 PM to 7 AM, please call Elink for urgent questions ? ? ? ? ? ? ? ?

## 2021-09-24 NOTE — Progress Notes (Signed)
error 

## 2021-09-24 NOTE — TOC Transition Note (Signed)
Transition of Care (TOC) - CM/SW Discharge Note ? ? ?Patient Details  ?Name: Calvin Moore ?MRN: [id number] ?Date of Birth: 1996/03/29 ? ?Transition of Care (TOC) CM/SW Contact:  ?Bess Kinds, RN ?Phone Number: [phone] ?09/24/2021, 9:49 AM ? ? ?Clinical Narrative:    ? ?Patient to transition home today. No TOC needs identified.  ? ?Final next level of care: Home/Self Care ?Barriers to Discharge: No Barriers Identified ? ? ?Patient Goals and CMS Choice ?  ?  ?Choice offered to / list presented to : NA ? ?Discharge Placement ?  ?           ?  ?  ?  ?  ? ?Discharge Plan and Services ?  ?  ?           ?DME Arranged: N/A ?DME Agency: NA ?  ?  ?  ?  ?HH Agency: NA ?  ?  ?  ? ?Social Determinants of Health (SDOH) Interventions ?  ? ? ?Readmission Risk Interventions ?   ? View : No data to display.  ?  ?  ?  ? ? ? ? ? ?

## 2021-09-26 LAB — QUANTIFERON-TB GOLD PLUS (RQFGPL)
QuantiFERON Mitogen Value: 2.6 IU/mL
QuantiFERON Nil Value: 0.06 IU/mL
QuantiFERON TB1 Ag Value: 0.07 IU/mL
QuantiFERON TB2 Ag Value: 0.06 IU/mL

## 2021-09-26 LAB — QUANTIFERON-TB GOLD PLUS: QuantiFERON-TB Gold Plus: NEGATIVE

## 2021-10-07 NOTE — Telephone Encounter (Signed)
Called pt w/interpreter services and made an [facility] appt  ?

## 2021-10-12 ENCOUNTER — Inpatient Hospital Stay: Payer: BC Managed Care – PPO | Admitting: Internal Medicine

## 2021-10-12 NOTE — Progress Notes (Signed)
Patient ID: Calvin Moore, male   DOB: 02-02-1996, 26 y.o.   MRN: [id number] ? ? ?Calvin Moore, is a 26 y.o. male ? ?[id number] ? ?[id number] ? ?DOB - Jan 01, 1996 ? ?Chief Complaint  ?Patient presents with  ? Hospitalization Follow-up  ?    ? ?Subjective:  ? ?Calvin Moore is a 26 y.o. male here today for a follow up visit from being hospitalized 3/21-4/06/2021 for multifocal pneumonia and necrotic lung mass.  He is feeling much better.  He had an ID appt yesterday but missed the appt due to some confusion I think related to language barrier.  He still has some residual CP but overall much better.  No SOB.  No wheezing.  No hemoptysis.  [facility] translation services provided an interpreting.   ? ?From hospital discharge summary: ? ?Patient will need outpatient follow-up of [name] by Dr. Jerilynn Mages. Singh of [facility] I will CC her on this note ?Patient will need follow-up with pulmonology for necrotic lung mass and this will be coordinated by Dr. Monico Blitz ?Complete a total of 1 month Augmentin-Rx given ?  ?Discharge Diagnoses: ?Principal Problem: ?  Multifocal pneumonia ?Active Problems: ?  Pericardial effusion ?  Splenomegaly ?  Thrombocytopenia (Calvin Moore) ?  Alcohol use ?  Hospital acquired PNA ?  Hypokalemia ?  Pleural effusion ? ?Hospital Course: ?26 year old Swahili male ?Prior heavy EtOH use ?Presented 09/13/2021 significant chest pain SOB TCP splenomegaly?  Cirrhosis ?3/24-still febrile repeat CT = worsening multifocal pneumonia + RUL abscess moderate loculated-CCM consulted ?3/25 IR guided chest tube placed--1 L out ?3/27 DNase/tPA first dose--800 cc out ?3/28 cardiovascular surgery consulted 2/27X 7 cm RUL masslike partial cavitary lesion-recommending conservative management ?3/29 pleural fibrinolysis performed again with dornase  ?3/31: Pain better controlled over the past several days not really using IV morphine, 14 French pigtail catheter removed ?  ?  ?Hospital-Problem based course ?  ?Necrotizing  cavitary pneumonia-low suspicion per PCCM for TB ?Legionella, strep pneumo, respiratory viral pathogen panel negative ?Antibiotics-->Unasyn--->Augmentin on discharge which patient will complete a total of 1 month of therapy ?Last CXR 3/29 AM R hydropneumothorax with interval decrease in fluid component ?Discussion with patient family Dr. Kipp Brood CVTS 3/30 and no role for operative management given high risks and low benefit from procedure and this will probably need outpatient reimaging although would not change management ?Pigtail chest tube removed 3/31 antibiotics adjusted as above stop date ?Prescription was given for antitussives ?Pain was controlled with predominantly Naprosyn ?Pleuritic chest pain ?Pain is improved on scheduled Naprosyn 500 back to twice daily ?Opiates and Dilaudid discontinued prior to discharge ??  Cavitary lesion risk for TB ?QuantiFERON 09/17/2021 was indeterminant--AFB sputum culture 09/17/2021 is pending ?Low risk for Dr. Graylon Good ID for TB but moved here from [place] 7 years ago ?Repeated QuantiFERON on 3/30-will be followed in the outpatient setting by Dr. 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 ?No problems updated. ? ?ALLERGIES: ?No Known Allergies ? ?PAST MEDICAL HISTORY: ?No past medical history on file. ? ?MEDICATIONS AT HOME: ?Prior to Admission medications   ?Medication Sig Start Date End Date Taking? Authorizing Provider  ?chlorpheniramine-HYDROcodone 10-8 MG/5ML Take 5 mLs by mouth every 12 (twelve) hours. 09/24/21   Samtani,  Jai-Gurmukh, MD  ?naproxen (NAPROSYN) 500 MG tablet Take 1 tablet (500 mg total) by  mouth 2 (two) times daily with a meal. Prn pain 10/13/21   Argentina Donovan, PA-C  ?oxyCODONE (OXY IR/ROXICODONE) 5 MG immediate release tablet Take 1 tablet (5 mg total) by mouth every 4 (four) hours as needed for breakthrough pain or severe pain. 09/24/21   Nita Sells, MD  ? ? ?ROS: ?Neg cardiac ?Neg GI ?Neg GU ?Neg MS ?Neg psych ?Neg neuro ? ?Objective:  ? ?Vitals:  ? 10/13/21 1444  ?BP: (!) 154/98  ?Pulse: 71  ?SpO2: 100%  ?Weight: 163 lb 9.6 oz (74.2 kg)  ? ?Exam ?General appearance : Awake, alert, not in any distress. Speech Clear. Not toxic looking ?HEENT: Atraumatic and Normocephalic ?Neck: Supple, no JVD. No cervical lymphadenopathy.  ?Chest: Good air entry bilaterally, CTAB.  No rales/rhonchi/wheezing ?CVS: S1 S2 regular, no murmurs.  ?Extremities: B/L Lower Ext shows no edema, both legs are warm to touch ?Neurology: Awake alert, and oriented X 3, CN II-XII intact, Non focal ?Skin: No Rash ? ?Data Review ?No results found for: HGBA1C ? ?Assessment & Plan  ? ?1. Hypokalemia ?- Comprehensive metabolic panel ? ?2. Thrombocytopenia (HCC) ?- CBC with Differential/Platelet ? ?3. Multifocal pneumonia ?Resolved clinically.  Pulmonology in May ?- Ambulatory referral to Infectious Disease(missed appt yesterday) ? ?4. Alcohol use ?[place]org is the website for narcotics anonymous ?[place] (website) or (915)514-5519 is the information for alcoholics anonymous ?Both are free and immediately available for help with alcohol and drug use ?- Comprehensive metabolic panel ? ?5. Chest pain, unspecified type ?- naproxen (NAPROSYN) 500 MG tablet; Take 1 tablet (500 mg total) by mouth 2 (two) times daily with a meal. Prn pain  Dispense: 40 tablet; Refill: 0 ? ?6. Hospital discharge follow-up ?Doing much better ? ?7. Language barrier ?[facility] interpreter used and additional time performing visit was required. ? ? ?8. Elevated blood pressure reading ?Check BP OOO and record and bring to next visit ? ?Return in  about 2 months (around 12/13/2021) for assign PCP. ? ?The patient was given clear instructions to go to ER or return to medical center if symptoms don't improve, worsen or new problems develop. The patient verbalized understanding. The patient was told to call to get lab results if they haven't heard anything in the next week.  ? ? ? ? ?Freeman Caldron, PA-C ?[facility] ?[place], [place] ?[phone]   ?10/13/2021, 4:01 PM  ?

## 2021-10-13 ENCOUNTER — Ambulatory Visit: Payer: BC Managed Care – PPO | Attending: Physician Assistant | Admitting: Physician Assistant

## 2021-10-13 VITALS — BP 154/98 | HR 71 | Wt 163.6 lb

## 2021-10-13 DIAGNOSIS — J189 Pneumonia, unspecified organism: Secondary | ICD-10-CM

## 2021-10-13 DIAGNOSIS — D696 Thrombocytopenia, unspecified: Secondary | ICD-10-CM

## 2021-10-13 DIAGNOSIS — Z09 Encounter for follow-up examination after completed treatment for conditions other than malignant neoplasm: Secondary | ICD-10-CM

## 2021-10-13 DIAGNOSIS — Z789 Other specified health status: Secondary | ICD-10-CM | POA: Diagnosis not present

## 2021-10-13 DIAGNOSIS — R030 Elevated blood-pressure reading, without diagnosis of hypertension: Secondary | ICD-10-CM

## 2021-10-13 DIAGNOSIS — E876 Hypokalemia: Secondary | ICD-10-CM

## 2021-10-13 DIAGNOSIS — R079 Chest pain, unspecified: Secondary | ICD-10-CM

## 2021-10-13 MED ORDER — NAPROXEN 500 MG PO TABS: 500.0000 mg | ORAL_TABLET | Freq: Two times a day (BID) | ORAL | 0 refills | Status: AC

## 2021-10-13 NOTE — Patient Instructions (Signed)
[place].org is the website for narcotics anonymous [facility] (website) or 336-854-4278 is the information for alcoholics anonymous Both are free and immediately available for help with alcohol and drug use  

## 2021-10-14 LAB — CBC WITH DIFFERENTIAL/PLATELET
Basophils Absolute: 0 10*3/uL (ref 0.0–0.2)
Basos: 1 %
EOS (ABSOLUTE): 0 10*3/uL (ref 0.0–0.4)
Eos: 1 %
Hematocrit: 33.6 % — ABNORMAL LOW (ref 37.5–51.0)
Hemoglobin: 11.3 g/dL — ABNORMAL LOW (ref 13.0–17.7)
Immature Grans (Abs): 0 10*3/uL (ref 0.0–0.1)
Immature Granulocytes: 0 %
Lymphocytes Absolute: 0.7 10*3/uL (ref 0.7–3.1)
Lymphs: 40 %
MCH: 27.7 pg (ref 26.6–33.0)
MCHC: 33.6 g/dL (ref 31.5–35.7)
MCV: 82 fL (ref 79–97)
Monocytes Absolute: 0.1 10*3/uL (ref 0.1–0.9)
Monocytes: 7 %
Neutrophils Absolute: 1 10*3/uL — ABNORMAL LOW (ref 1.4–7.0)
Neutrophils: 51 %
Platelets: 77 10*3/uL — CL (ref 150–450)
RBC: 4.08 x10E6/uL — ABNORMAL LOW (ref 4.14–5.80)
RDW: 15.3 % (ref 11.6–15.4)
WBC: 1.9 10*3/uL — CL (ref 3.4–10.8)

## 2021-10-14 LAB — COMPREHENSIVE METABOLIC PANEL
ALT: 24 IU/L (ref 0–44)
AST: 25 IU/L (ref 0–40)
Albumin/Globulin Ratio: 1.4 (ref 1.2–2.2)
Albumin: 3.8 g/dL — ABNORMAL LOW (ref 4.1–5.2)
Alkaline Phosphatase: 71 IU/L (ref 44–121)
BUN/Creatinine Ratio: 5 — ABNORMAL LOW (ref 9–20)
BUN: 4 mg/dL — ABNORMAL LOW (ref 6–20)
Bilirubin Total: 0.3 mg/dL (ref 0.0–1.2)
CO2: 25 mmol/L (ref 20–29)
Calcium: 9 mg/dL (ref 8.7–10.2)
Chloride: 106 mmol/L (ref 96–106)
Creatinine, Ser: 0.88 mg/dL (ref 0.76–1.27)
Globulin, Total: 2.8 g/dL (ref 1.5–4.5)
Glucose: 89 mg/dL (ref 70–99)
Potassium: 4.1 mmol/L (ref 3.5–5.2)
Sodium: 144 mmol/L (ref 134–144)
Total Protein: 6.6 g/dL (ref 6.0–8.5)
eGFR: 121 mL/min/{1.73_m2} (ref >59)

## 2021-10-16 ENCOUNTER — Other Ambulatory Visit: Payer: Self-pay | Admitting: Physician Assistant

## 2021-10-16 DIAGNOSIS — D709 Neutropenia, unspecified: Secondary | ICD-10-CM

## 2021-10-16 DIAGNOSIS — D696 Thrombocytopenia, unspecified: Secondary | ICD-10-CM

## 2021-10-17 NOTE — Progress Notes (Addendum)
? ?   ? ?Patient Active Problem List  ? Diagnosis Date Noted  ? Hypokalemia 09/17/2021  ? Pleural effusion   ? Thrombocytopenia (HCC) 09/14/2021  ? Alcohol use 09/14/2021  ? Hospital acquired PNA 09/14/2021  ? Multifocal pneumonia 09/13/2021  ? Pericardial effusion 09/13/2021  ? Splenomegaly 09/13/2021  ? ? ?Patient's Medications  ?New Prescriptions  ? No medications on file  ?Previous Medications  ? CHLORPHENIRAMINE-HYDROCODONE 10-8 MG/5ML    Take 5 mLs by mouth every 12 (twelve) hours.  ? NAPROXEN (NAPROSYN) 500 MG TABLET    Take 1 tablet (500 mg total) by mouth 2 (two) times daily with a meal. Prn pain  ? OXYCODONE (OXY IR/ROXICODONE) 5 MG IMMEDIATE RELEASE TABLET    Take 1 tablet (5 mg total) by mouth every 4 (four) hours as needed for breakthrough pain or severe pain.  ?Modified Medications  ? No medications on file  ?Discontinued Medications  ? No medications on file  ? ? ?Subjective: ?26 Y O male with PMH of Alcoholic Liver cirrhosis with splenomegaly,  alcohol abuse who is here for HFU for cavitary pna and loculated rt sided  pleural effusion. Recently admitted 3/21-09/24/21 for RUL abscess and loculated pleural effusion. S/p CT guided drain placement 3/25 (Fluid cx routine/fungal stain and cx no growth, AFB stain negative, AFB cx pending, path negative for malignant cells), s/p tpa.  CVTS recommended conservative management. 3/31 pigtail catheter removed. Quantiferon 3/25 indeterminate, 3/30 negative. Discharged on 4/1 with 17 days of augmentin after initially being on Unasyn from 3/24 ( ceftriaxone and azithromycin 3/21-3/24) ? ?Spoke with patient with the help of swahili interpreter. Has completed recommended duration of augmentin until 4/17. Denies fevers, chills and sweats. Denies cough, SOB and minimal chest pain some times. Denies nausea, vomiting and diarrhea. Denies GU symptoms , joint pain and rashes.  ? ?Originally from [place] and moved to [place] through [place] for work. He currently works in a  Artist. Denies smoking, drinks alcohol occasionally, can go up to 1 month without drinking, drinks 4-5 beers at a time. Denies IVDU. Sexually active with a male partner. ? ?Denies ever having h/o TB or treatment for it. Denies close contact with TB or h/o family members with TB.Marland Kitchen Denies h/o being homeless, incarcerated and lived in a congregate settings. Appetite is good and weight he thinks might has lost few lbs.  ? ?Review of Systems: all systems reviewed with pertinent positives and negatives as listed above ? ?No past medical history on file. ? ?No past surgical history on file. ? ? ?Social History  ? ?Tobacco Use  ? Smoking status: Never  ? Smokeless tobacco: Never  ?Substance Use Topics  ? Alcohol use: Yes  ?  Comment: per pt during celebrations, per brother often almost daily  ? Drug use: No  ? ? ?No family history on file. ? ?No Known Allergies ? ?Health Maintenance  ?Topic Date Due  ? COVID-19 Vaccine (1) Never done  ? INFLUENZA VACCINE  01/24/2022  ? TETANUS/TDAP  02/21/2026  ? Hepatitis C Screening  Completed  ? HIV Screening  Completed  ? HPV VACCINES  Aged Out  ? ? ?Objective: ?BP 134/86   Pulse 73   Temp 98.3 ?F (36.8 ?C) (Oral)   Wt 164 lb (74.4 kg)   BMI 25.69 kg/m?  ? ? ?Physical Exam ?Constitutional:   ?   Appearance: Normal appearance.  ?HENT:  ?   Head: Normocephalic and atraumatic.   ?   Mouth: Mucous  membranes are moist.  ?Eyes: ?   Conjunctiva/sclera: Conjunctivae normal.  ?   Pupils: Pupils are equal, round ? ?Cardiovascular:  ?   Rate and Rhythm: Normal rate and regular rhythm.  ?   Heart sounds:  ? ?Pulmonary:  ?   Effort: Pulmonary effort is normal on room air ?   Breath sounds: Normal breath sounds.  ? ?Abdominal:  ?   General: Non distended  ?   Palpations: soft.  ? ?Musculoskeletal:     ?   General: Normal range of motion.  ? ?Skin: ?   General: Skin is warm and dry.  ?   Comments: ? ?Neurological:  ?   General: grossly non focal  ?   Mental Status: awake, alert and  oriented to person, place, and time.  ? ?Psychiatric:     ?   Mood and Affect: Mood normal.  ? ?Lab Results ?Lab Results  ?Component Value Date  ? WBC 1.9 (LL) 10/13/2021  ? HGB 11.3 (L) 10/13/2021  ? HCT 33.6 (L) 10/13/2021  ? MCV 82 10/13/2021  ? PLT 77 (LL) 10/13/2021  ?  ?Lab Results  ?Component Value Date  ? CREATININE 0.88 10/13/2021  ? BUN 4 (L) 10/13/2021  ? NA 144 10/13/2021  ? K 4.1 10/13/2021  ? CL 106 10/13/2021  ? CO2 25 10/13/2021  ?  ?Lab Results  ?Component Value Date  ? ALT 24 10/13/2021  ? AST 25 10/13/2021  ? ALKPHOS 71 10/13/2021  ? BILITOT 0.3 10/13/2021  ?  ?No results found for: CHOL, HDL, LDLCALC, LDLDIRECT, TRIG, CHOLHDL ?No results found for: LABRPR, RPRTITER ?No results found for: HIV1RNAQUANT, HIV1RNAVL, CD4TABS ?  ?Imaging ?DG CHEST PORT 1 VIEW ? ?Result Date: 09/24/2021 ?CLINICAL DATA:  Chest pain.  Evaluate opacity.  Pneumothorax. EXAM: PORTABLE CHEST 1 VIEW COMPARISON:  September 23, 2021 FINDINGS: The cardiomediastinal silhouette is stable. No pneumothorax. The left lung is clear. Hazy opacity centered in the periphery of the right mid lung is similar in the interval. More mild hazy opacity extends inferiorly, similar in the interval. Mild thickening of the right-sided fissures suggest a small amount of fluid in the fissure. Overall, there has been little to no change. IMPRESSION: 1. No pneumothorax. 2. The opacity in the right lung, described above, is similar in the interval. 3. No other changes. Electronically Signed   By: Gerome Samavid  Williams III M.D.   On: 09/24/2021 10:06  ? ?DG Chest Port 1 View ? ?Result Date: 09/23/2021 ?CLINICAL DATA:  Right chest tube removed. EXAM: PORTABLE CHEST 1 VIEW COMPARISON:  Radiograph dated September 23, 2021 at 6:16 a.m. FINDINGS: The heart size and mediastinal contours are within normal limits. Stable right upper lobe hazy opacity. Small right apical pneumothorax is unchanged. Interval removal of the right-sided chest tube. No acute osseous abnormality.  IMPRESSION: 1. Interval removal of the chest tube, stable small right apical pneumothorax. 2. Right upper lobe hazy opacity concerning for airspace disease, unchanged. Electronically Signed   By: Larose HiresImran  Ahmed D.O.   On: 09/23/2021 11:45  ? ?DG CHEST PORT 1 VIEW ? ?Result Date: 09/23/2021 ?CLINICAL DATA:  Pleural effusion. EXAM: PORTABLE CHEST 1 VIEW COMPARISON:  September 22, 2021. FINDINGS: Stable cardiomegaly. Stable position of right-sided pleural drainage catheter. Probable minimal right apical pneumothorax is noted. Stable right upper lobe opacity is noted concerning for pneumonia. Minimal left pleural effusion is noted. Bony thorax is unremarkable. IMPRESSION: Stable position of right-sided pleural drainage catheter. Minimal right apical pneumothorax is noted.  Stable right upper lobe opacity is noted concerning for pneumonia. Electronically Signed   By: Lupita Raider M.D.   On: 09/23/2021 08:17  ? ?DG CHEST PORT 1 VIEW ? ?Result Date: 09/22/2021 ?CLINICAL DATA:  Multifocal pneumonia. Chest pain. Abdominal pain. Chest tube in place. EXAM: PORTABLE CHEST 1 VIEW COMPARISON:  AP chest 09/21/2021, CT chest 09/19/2021 FINDINGS: There is again a small caliber chest tube overlying the right lung base. There is again elevation of the right hemidiaphragm there is opacification of the right lung base corresponding to the loculated pleural fluid seen on prior CT. Lateral mid to upper right lung opacity is unchanged from 09/21/2021 radiographs, corresponding to the consolidation with air bronchograms and potential cavitary lesions on prior CT. Tiny left pleural effusion. Cardiac silhouette and mediastinal contours are within normal limits. No definite pneumothorax is identified. No acute skeletal abnormality. IMPRESSION: The questioned tiny right apical pneumothorax on 09/21/2021 frontal radiograph is not as well visualized on the current study. Otherwise, no significant change from 09/21/2021 radiographs. Right basilar chest  tube with loculated right basilar pleural effusion. Lateral mid to upper right lung consolidation corresponding to the area concerning for cavitary infection on prior CT. Electronically Signed   By: Neita Garnet M.D.   O

## 2021-10-18 LAB — FUNGUS CULTURE WITH STAIN

## 2021-10-18 LAB — FUNGAL ORGANISM REFLEX

## 2021-10-18 LAB — FUNGUS CULTURE RESULT

## 2021-10-19 ENCOUNTER — Ambulatory Visit: Payer: BC Managed Care – PPO | Admitting: Infectious Diseases

## 2021-10-19 ENCOUNTER — Encounter: Payer: Self-pay | Admitting: Infectious Diseases

## 2021-10-19 ENCOUNTER — Other Ambulatory Visit: Payer: Self-pay

## 2021-10-19 VITALS — BP 134/86 | HR 73 | Temp 98.3°F | Wt 164.0 lb

## 2021-10-19 DIAGNOSIS — J189 Pneumonia, unspecified organism: Secondary | ICD-10-CM

## 2021-10-19 DIAGNOSIS — Z789 Other specified health status: Secondary | ICD-10-CM | POA: Diagnosis not present

## 2021-10-19 DIAGNOSIS — K7030 Alcoholic cirrhosis of liver without ascites: Secondary | ICD-10-CM | POA: Diagnosis not present

## 2021-10-19 DIAGNOSIS — Z129 Encounter for screening for malignant neoplasm, site unspecified: Secondary | ICD-10-CM | POA: Diagnosis not present

## 2021-10-19 DIAGNOSIS — J984 Other disorders of lung: Secondary | ICD-10-CM

## 2021-10-21 ENCOUNTER — Ambulatory Visit (HOSPITAL_COMMUNITY): Payer: BC Managed Care – PPO

## 2021-10-28 ENCOUNTER — Encounter: Payer: Self-pay | Admitting: *Deleted

## 2021-10-28 ENCOUNTER — Ambulatory Visit (HOSPITAL_COMMUNITY): Payer: BC Managed Care – PPO

## 2021-10-28 NOTE — Progress Notes (Signed)
Letter created in error

## 2021-11-02 LAB — ACID FAST CULTURE WITH REFLEXED SENSITIVITIES (MYCOBACTERIA): Acid Fast Culture: NEGATIVE

## 2021-11-14 ENCOUNTER — Inpatient Hospital Stay: Payer: BC Managed Care – PPO | Admitting: Pulmonary Disease

## 2022-01-30 ENCOUNTER — Ambulatory Visit: Payer: BC Managed Care – PPO | Admitting: Family Medicine

## 2022-03-22 ENCOUNTER — Ambulatory Visit (HOSPITAL_COMMUNITY)
Admission: RE | Admit: 2022-03-22 | Discharge: 2022-03-22 | Disposition: A | Discharge status: Home / Self Care | Payer: BC Managed Care – PPO | Source: Ambulatory Visit | Attending: Infectious Diseases | Admitting: Infectious Diseases

## 2022-03-22 ENCOUNTER — Ambulatory Visit (HOSPITAL_COMMUNITY): Admission: RE | Admit: 2022-03-22 | Payer: BC Managed Care – PPO | Source: Ambulatory Visit

## 2022-03-22 DIAGNOSIS — Z129 Encounter for screening for malignant neoplasm, site unspecified: Secondary | ICD-10-CM | POA: Insufficient documentation

## 2022-03-24 ENCOUNTER — Other Ambulatory Visit: Payer: Self-pay | Admitting: Infectious Diseases

## 2022-03-24 ENCOUNTER — Telehealth: Payer: Self-pay

## 2022-03-24 DIAGNOSIS — K7030 Alcoholic cirrhosis of liver without ascites: Secondary | ICD-10-CM

## 2022-03-24 NOTE — Telephone Encounter (Signed)
Used [employer] with [employer] account planner - patient didn't answer - left voicemail asking patient to return my call.    [place], CMA

## 2022-03-24 NOTE — Telephone Encounter (Signed)
-----   Message from Rosiland Oz, MD sent at 03/24/2022  6:31 AM EDT ----- US findings noted. Please let him know he needs to fu with Hepatology for Liver cirrhosis, GI for screening esophageal varices and needs [place] every 6 months for screening Liver cancer

## 2023-04-21 IMAGING — DX DG CHEST 1V PORT
1 series · 1 of 1 positions shown · non-contrast
Comparison: Chest x-ray dated September 21, 2021

CLINICAL DATA: Loculated pleural effusion.

EXAM:
PORTABLE CHEST 1 VIEW

[chest ap]
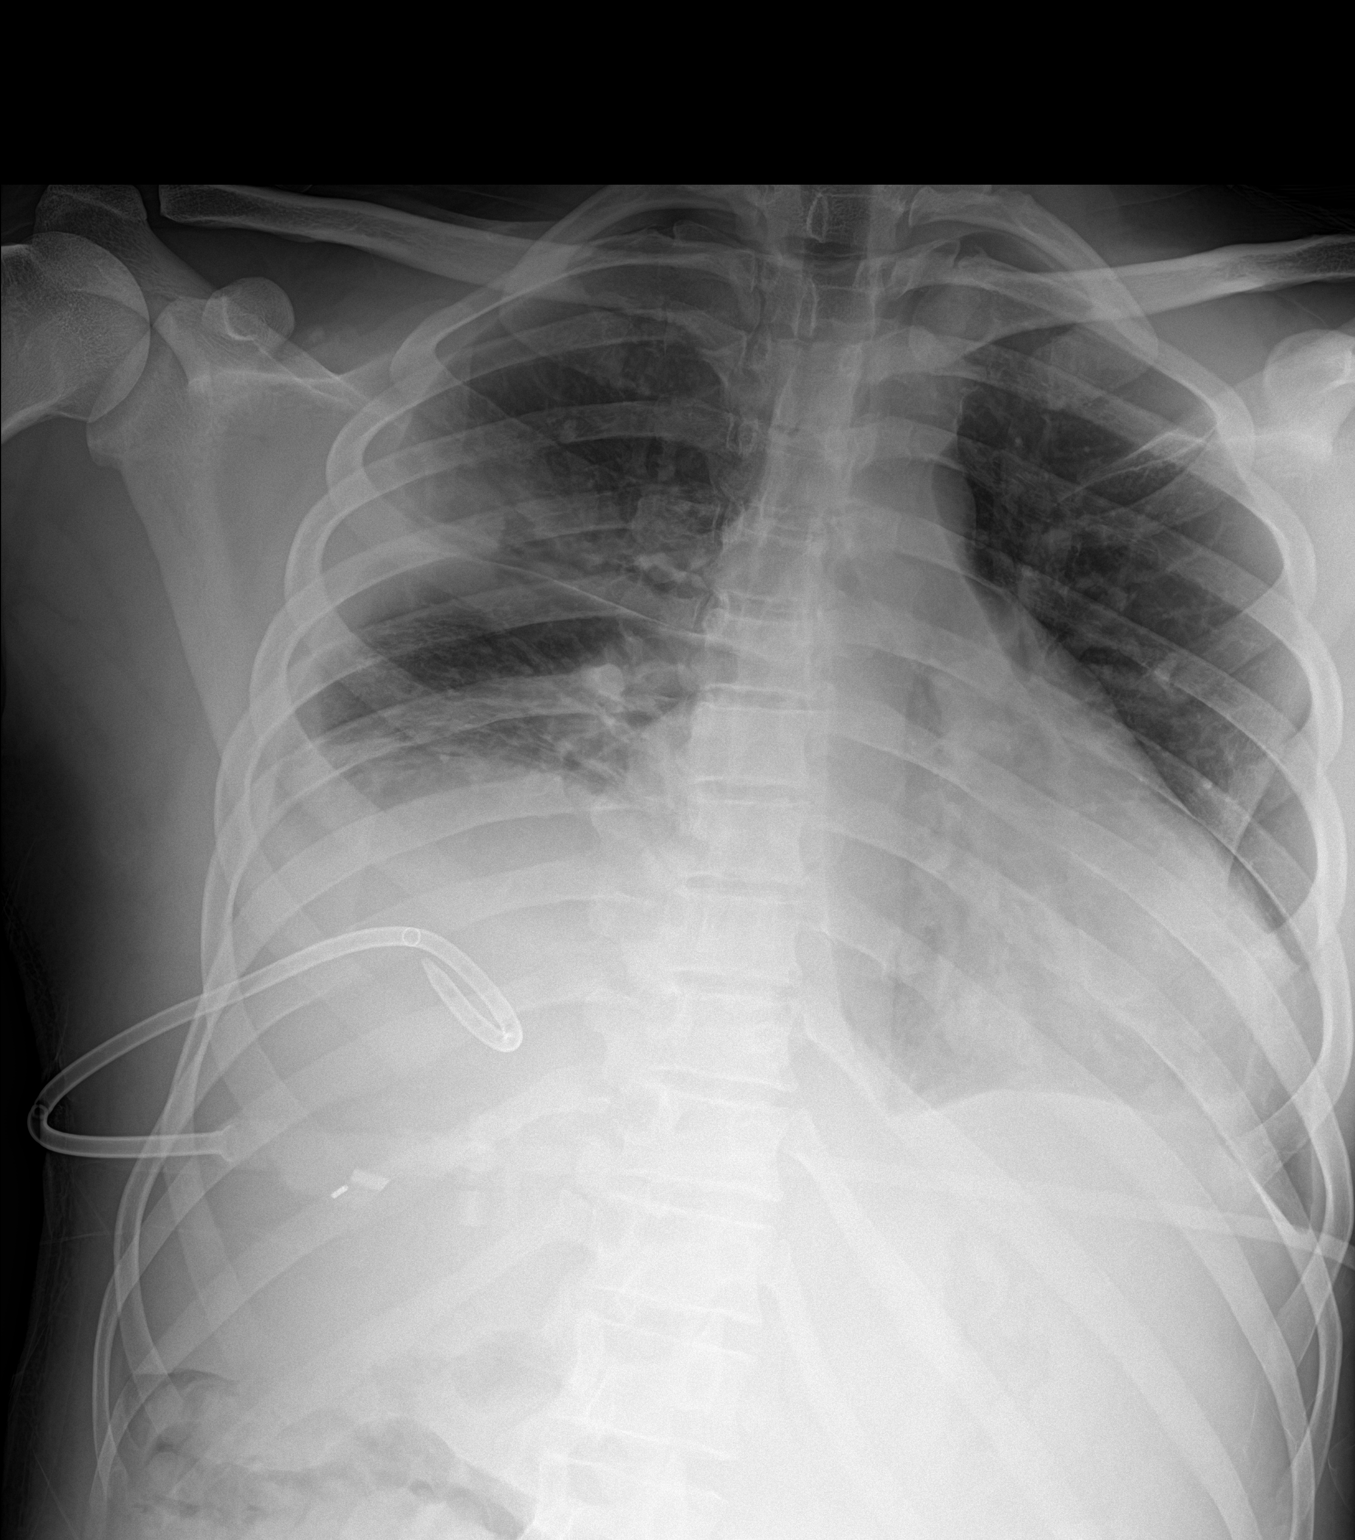

[1 of 1 positions shown; findings below may reference images not displayed]

FINDINGS: Cardiac and mediastinal contours are unchanged. Slightly decreased
size of loculated right pleural effusion. Unchanged elevation of the
right hemidiaphragm. Shifting of air with interval resolution of
right basilar pneumothorax and new trace right apical pneumothorax.
IMPRESSION: Right hydropneumothorax with interval decreased size of fluid
component and small associated right apical pneumothorax. Chest tube
in place.

## 2023-04-22 IMAGING — DX DG CHEST 1V PORT
1 series · 1 of 1 positions shown · non-contrast
Comparison: AP chest 09/21/2021, CT chest 09/19/2021

CLINICAL DATA: Multifocal pneumonia. Chest pain. Abdominal pain.
Chest tube in place.

EXAM:
PORTABLE CHEST 1 VIEW

[chest ap]
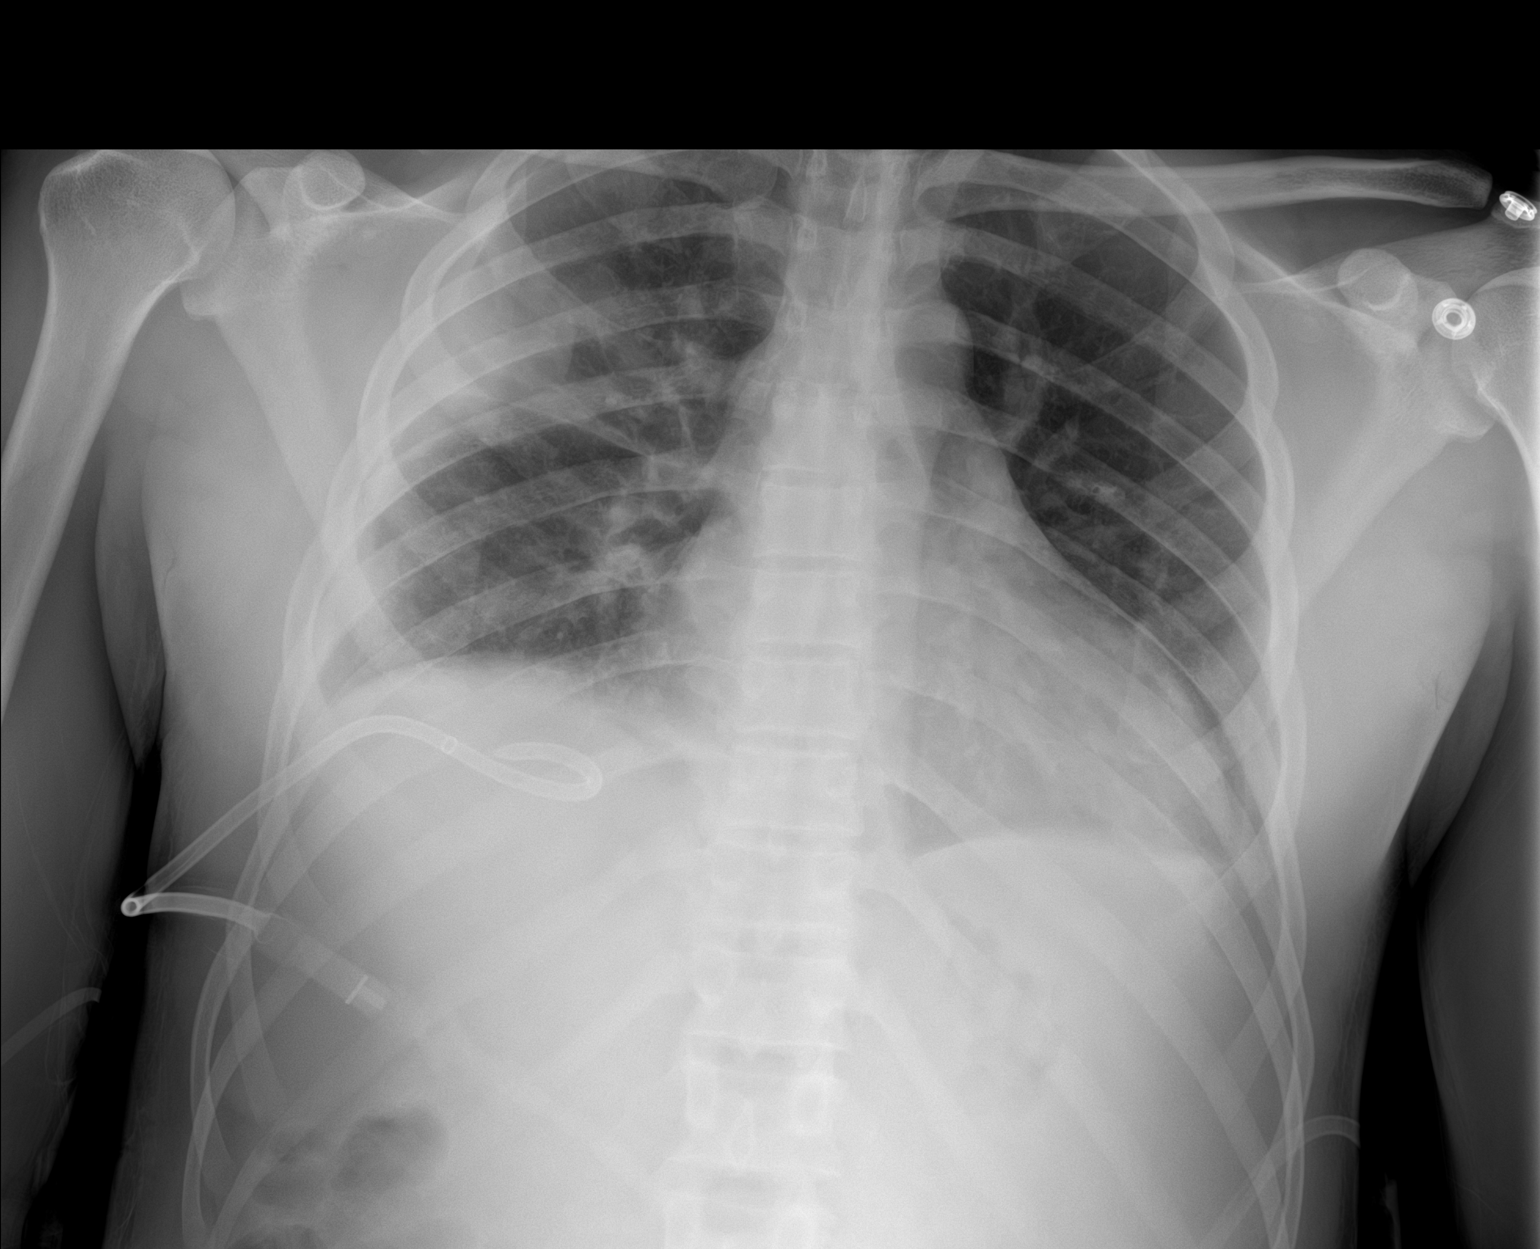

[1 of 1 positions shown; findings below may reference images not displayed]

FINDINGS: There is again a small caliber chest tube overlying the right lung
base. There is again elevation of the right hemidiaphragm there is
opacification of the right lung base corresponding to the loculated
pleural fluid seen on prior CT.

Lateral mid to upper right lung opacity is unchanged from 09/21/2021
radiographs, corresponding to the consolidation with air
bronchograms and potential cavitary lesions on prior CT. Tiny left
pleural effusion.

Cardiac silhouette and mediastinal contours are within normal
limits.

No definite pneumothorax is identified. No acute skeletal
abnormality.
IMPRESSION: The questioned tiny right apical pneumothorax on 09/21/2021 frontal
radiograph is not as well visualized on the current study.
Otherwise, no significant change from 09/21/2021 radiographs.

Right basilar chest tube with loculated right basilar pleural
effusion. Lateral mid to upper right lung consolidation
corresponding to the area concerning for cavitary infection on prior
CT.

## 2023-04-23 IMAGING — DX DG CHEST 1V PORT
1 series · 1 of 1 positions shown · non-contrast
Comparison: September 22, 2021.

CLINICAL DATA: Pleural effusion.

EXAM:
PORTABLE CHEST 1 VIEW

[chest ap]
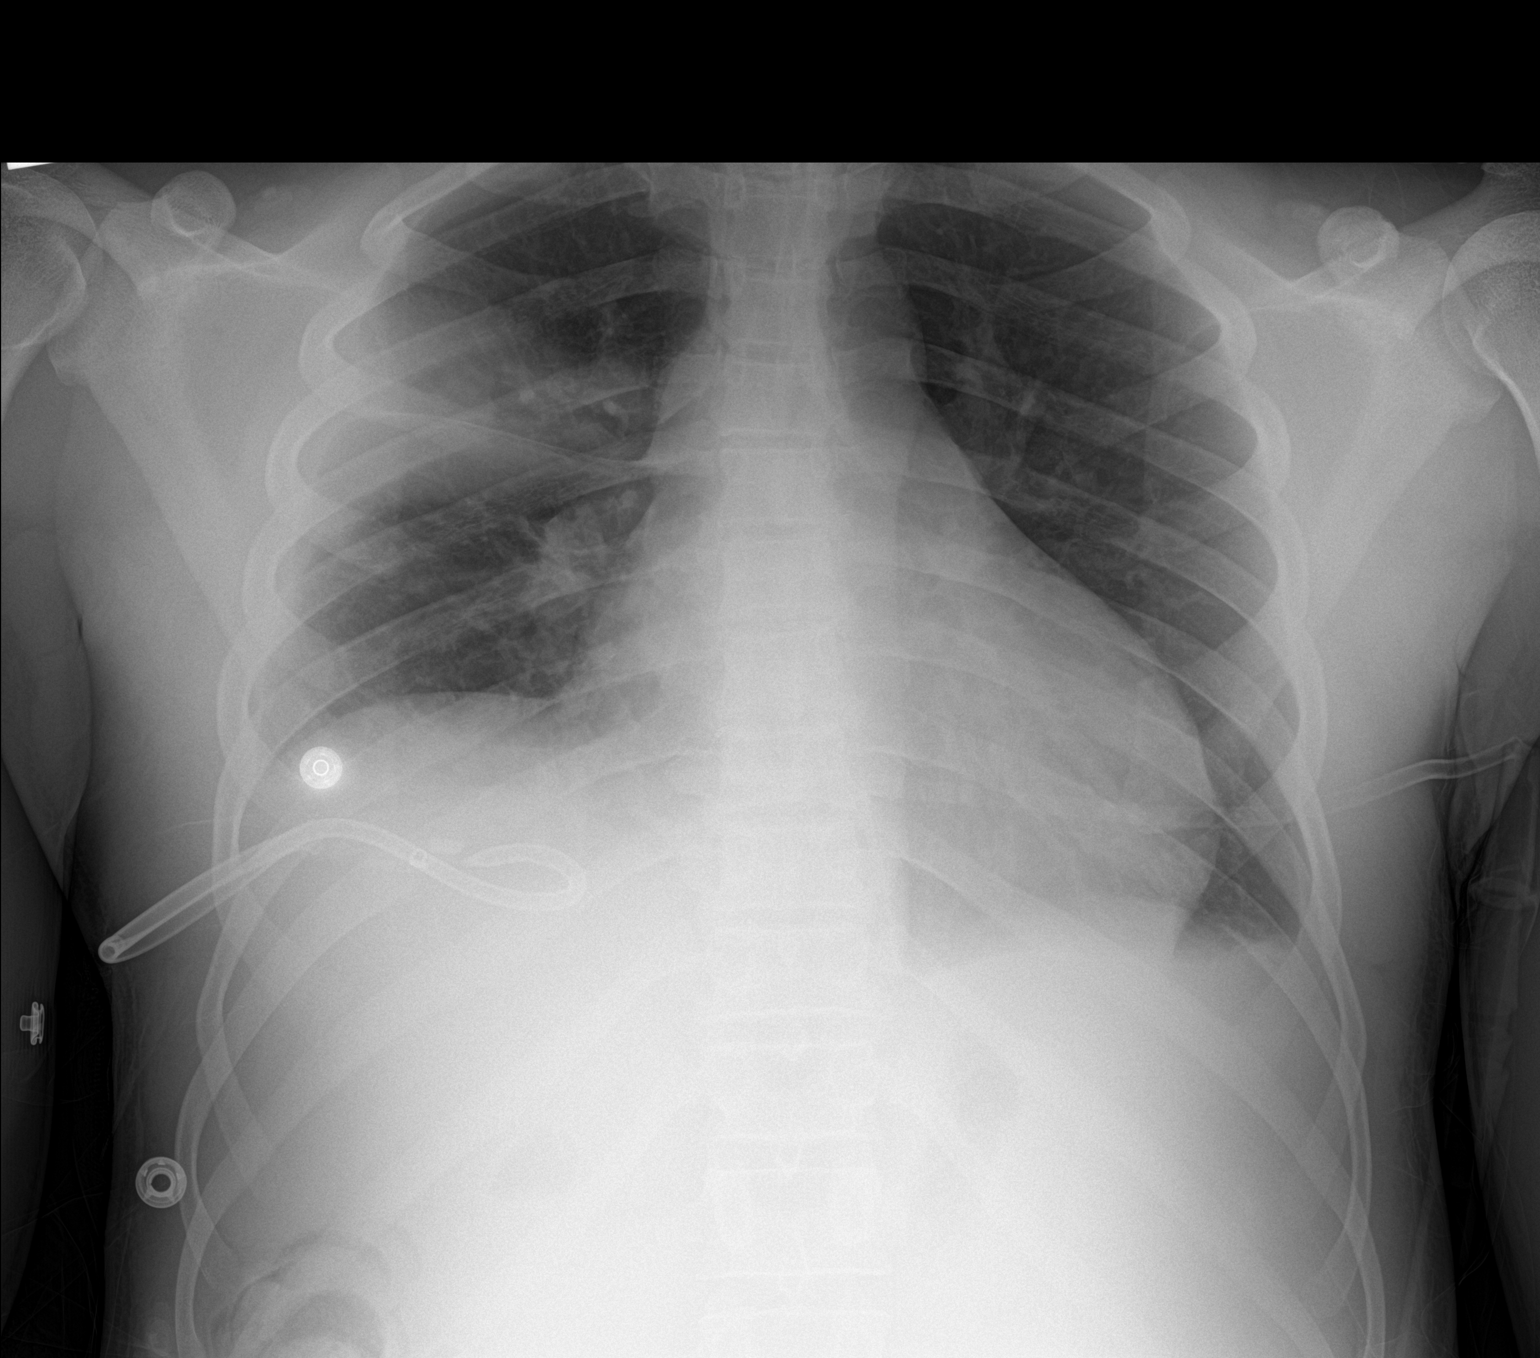

[1 of 1 positions shown; findings below may reference images not displayed]

FINDINGS: Stable cardiomegaly. Stable position of right-sided pleural drainage
catheter. Probable minimal right apical pneumothorax is noted.
Stable right upper lobe opacity is noted concerning for pneumonia.
Minimal left pleural effusion is noted. Bony thorax is unremarkable.
IMPRESSION: Stable position of right-sided pleural drainage catheter. Minimal
right apical pneumothorax is noted. Stable right upper lobe opacity
is noted concerning for pneumonia.

## 2023-04-23 IMAGING — DX DG CHEST 1V PORT
1 series · 1 of 1 positions shown · non-contrast
Comparison: Radiograph dated September 23, 2021 at [DATE] a.m.

CLINICAL DATA: Right chest tube removed.

EXAM:
PORTABLE CHEST 1 VIEW

[chest ap]
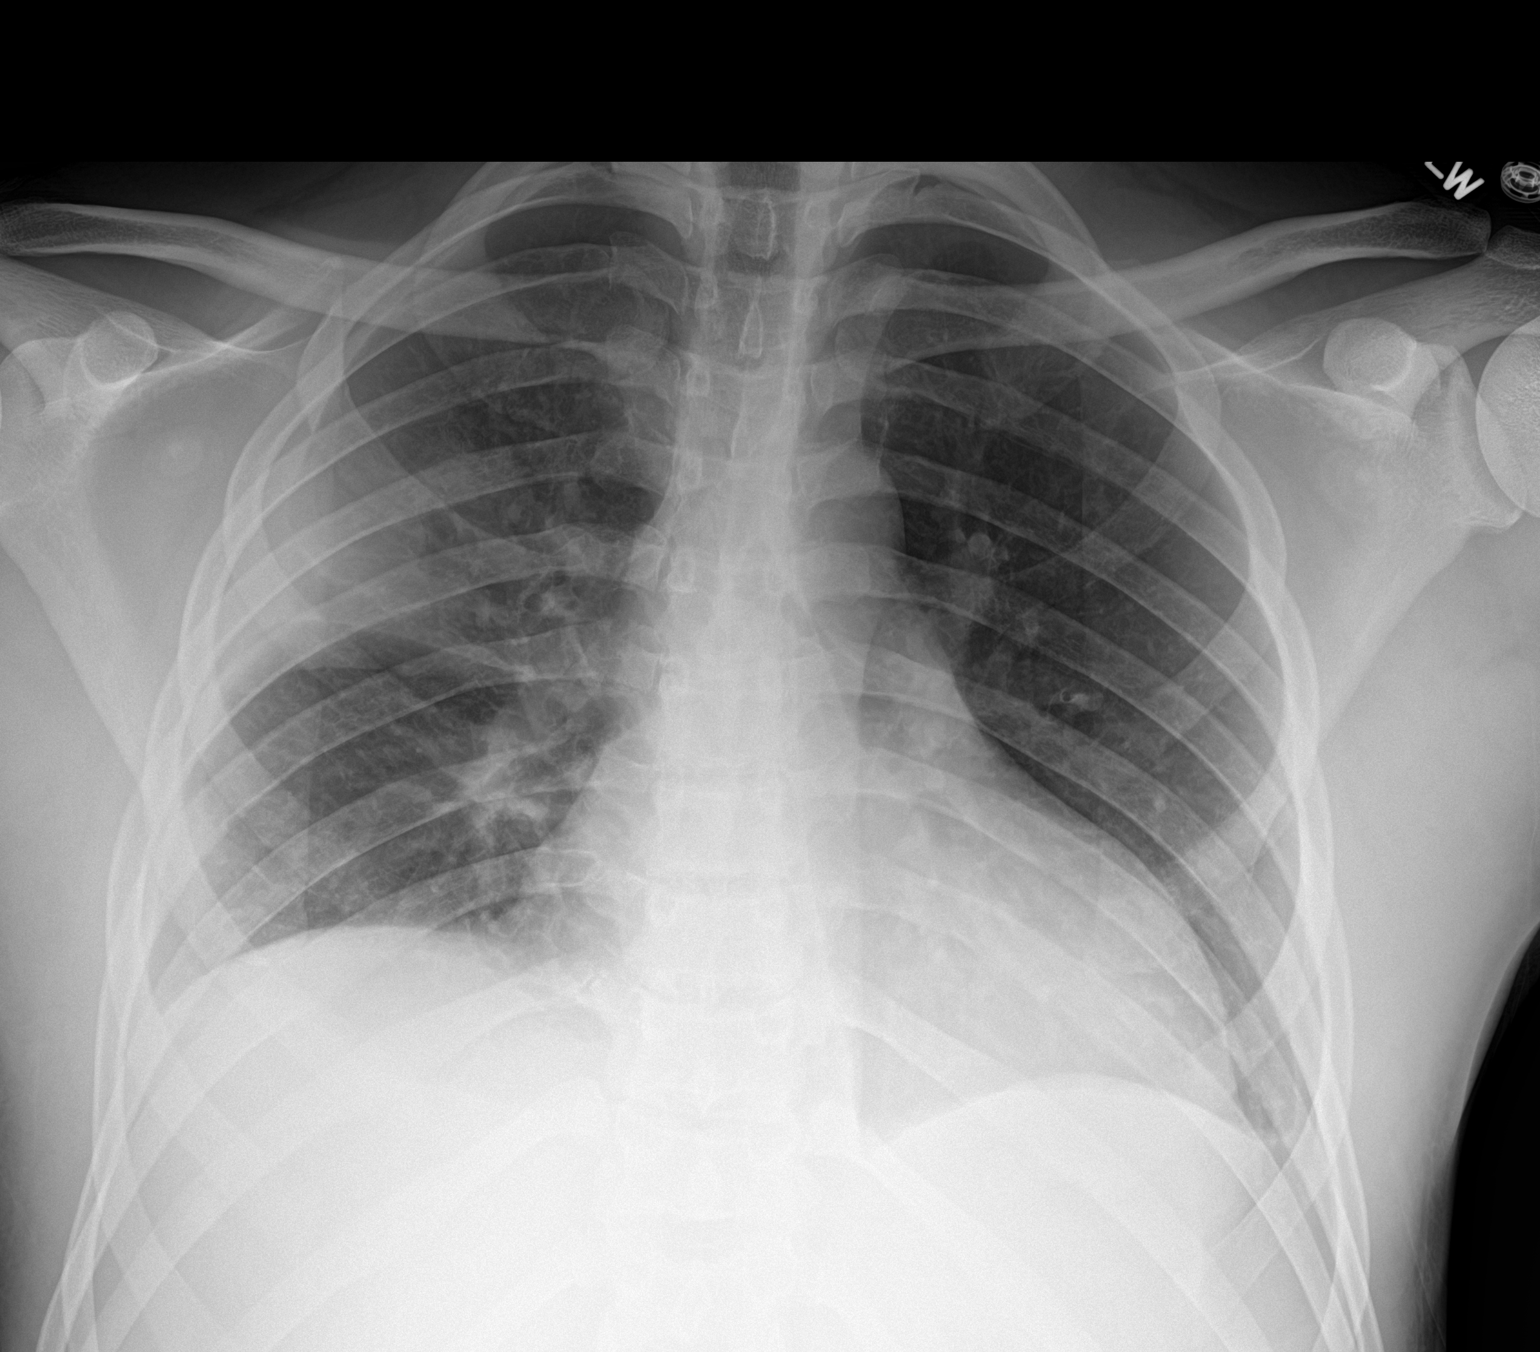

[1 of 1 positions shown; findings below may reference images not displayed]

FINDINGS: The heart size and mediastinal contours are within normal limits.
Stable right upper lobe hazy opacity. Small right apical
pneumothorax is unchanged. Interval removal of the right-sided chest
tube. No acute osseous abnormality.
IMPRESSION: 1. Interval removal of the chest tube, stable small right apical
pneumothorax.
2. Right upper lobe hazy opacity concerning for airspace disease,
unchanged.

## 2023-04-24 IMAGING — DX DG CHEST 1V PORT
1 series · 1 of 1 positions shown · non-contrast
Comparison: September 23, 2021

CLINICAL DATA: Chest pain.  Evaluate opacity.  Pneumothorax.

EXAM:
PORTABLE CHEST 1 VIEW

[chest ap]
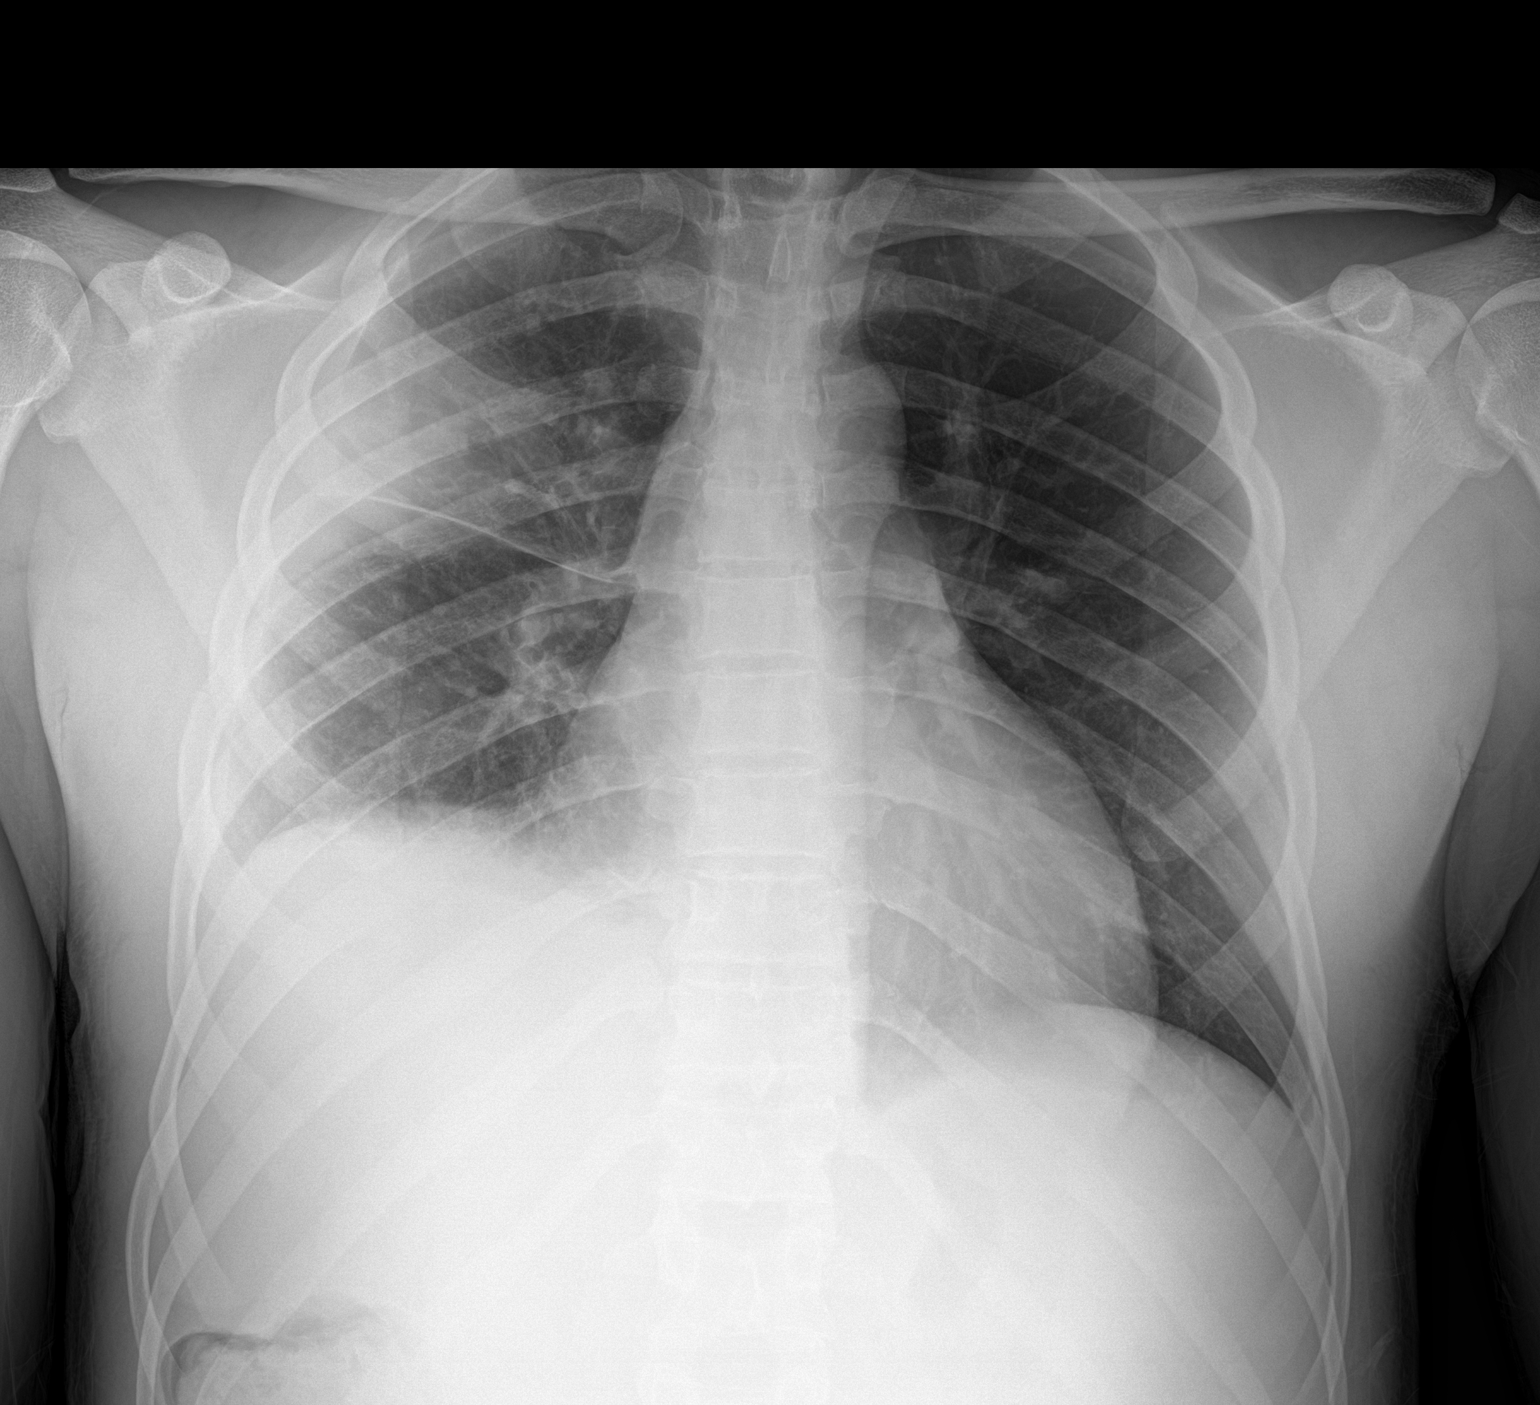

[1 of 1 positions shown; findings below may reference images not displayed]

FINDINGS: The cardiomediastinal silhouette is stable. No pneumothorax. The
left lung is clear. Hazy opacity centered in the periphery of the
right mid lung is similar in the interval. More mild hazy opacity
extends inferiorly, similar in the interval. Mild thickening of the
right-sided fissures suggest a small amount of fluid in the fissure.
Overall, there has been little to no change.
IMPRESSION: 1. No pneumothorax.
2. The opacity in the right lung, described above, is similar in the
interval.
3. No other changes.
